# Patient Record
Sex: Male | Born: 1985 | Race: White | Hispanic: No | Marital: Single | Smoking: Current every day smoker
Health system: Northeastern US, Academic
[De-identification: ages and names within clinical notes are randomized; demographics above are authoritative.]

---

## 2017-06-15 ENCOUNTER — Ambulatory Visit: Admit: 2017-06-15 | Discharge: 2017-06-16 | Payer: BC Managed Care – PPO

## 2017-06-15 ENCOUNTER — Encounter: Admit: 2017-06-15 | Discharge: 2017-06-15 | Payer: BC Managed Care – PPO

## 2017-06-15 DIAGNOSIS — H18603 Keratoconus, unspecified, bilateral: Principal | ICD-10-CM

## 2017-06-15 MED ORDER — OLOPATADINE 0.2 % OP DROP
1 [drp] | Freq: Every day | OPHTHALMIC | 6 refills | 27.50000 days | Status: AC
Start: 2017-06-15 — End: 2018-11-08

## 2017-06-20 NOTE — Assessment & Plan Note
Medically necessary RGP scleral lens diagnostic fit done today               Diagnosis:  keratoconus               See notes on lens fit, order    Schedule dispense @ [facility] when lens arrives, previous wearer  Need OCT over lens / autorefract after insertion      Medically necessary CL fit due to keratoconus (E45.409)  If insurance coverage, please order following lenses

## 2017-07-09 ENCOUNTER — Encounter: Admit: 2017-07-09 | Discharge: 2017-07-09 | Payer: BC Managed Care – PPO

## 2017-07-09 NOTE — Telephone Encounter
PT would like to know if he needs to have an appointment at this time, his contacts are in and was called by optical, Christine/Optical stated there were no instructions about   making an appointment.  He is insistent about receiving a call about this issue, Says he can never get anyone in optical to  return his call nor get an answer. Complaining about being treated rude and did not want to come in until he hears from Dr.

## 2017-07-11 ENCOUNTER — Ambulatory Visit: Admit: 2017-07-11 | Discharge: 2017-07-11 | Payer: BC Managed Care – PPO

## 2017-07-11 DIAGNOSIS — H18603 Keratoconus, unspecified, bilateral: Principal | ICD-10-CM

## 2017-07-13 NOTE — Progress Notes
There is no height or weight on file to calculate BMI.              Assessment and Plan:    Problem   Keratoconus of Both Eyes    Diagnoses in [facility] 2017         Keratoconus of both eyes  Scleral lens dispense today   OD - almost central touch, increase 100 um, steep meridian slight steep   OS - good central, edges ok   Need to incorporate overRx    Pt left with current lenses, ok to wear  Will order new trials    Lens on order from [facility], needs appt at [facility] when it arrives   Brand Base Curve Diameter Sphere Cylinder Axis Lens   Right Synergeyes VS scleral 3700 / 8.0 15.0 +0.75 -1.50 067 36-44   Left Synergeyes VS scleral 3500 / 8.0 15.0 +0.50 Sphere  36-40

## 2017-07-13 NOTE — Assessment & Plan Note
Scleral lens dispense today   OD - almost central touch, increase 100 um, steep meridian slight steep   OS - good central, edges ok   Need to incorporate overRx    Pt left with current lenses, ok to wear  Will order new trials

## 2017-08-15 ENCOUNTER — Encounter: Admit: 2017-08-15 | Discharge: 2017-08-15 | Payer: BC Managed Care – PPO

## 2017-08-15 NOTE — Telephone Encounter
Left message to call and schedule, adv nothing avail till January

## 2017-08-24 ENCOUNTER — Encounter: Admit: 2017-08-24 | Discharge: 2017-08-24 | Payer: BC Managed Care – PPO

## 2017-08-24 ENCOUNTER — Ambulatory Visit: Admit: 2017-08-24 | Discharge: 2017-08-25 | Payer: BC Managed Care – PPO

## 2017-08-24 DIAGNOSIS — H18603 Keratoconus, unspecified, bilateral: Principal | ICD-10-CM

## 2017-08-24 NOTE — Progress Notes
There is no height or weight on file to calculate BMI.              Assessment and Plan:    Problem   Keratoconus of Both Eyes    Diagnoses in [facility] 2017         Keratoconus of both eyes  Scleral lens dispense today   OD - excessive impingement, temporal pinguecula worse, good central, lens flexure   OS - good central, edges ok, impinge on pinguecula    Will reduce diameter OU to avoid pinguecula, return to sph only OD, overRx next time      Too steep OD, lens flexure, landing on pinguecula, shrink diameter OU  Lens below is on order, schedule dispense when it arrives (talk with Vickie about schedule)   Brand Base Curve Diameter Sphere Cylinder Axis Lens   Right Synergeyes VS scleral 3700 / 8.0 14.0 +0.25 Sphere  36-42   Left Synergeyes VS scleral 3500 / 8.0 14.0 +0.50 Sphere  36-40

## 2017-09-07 ENCOUNTER — Encounter: Admit: 2017-09-07 | Discharge: 2017-09-07 | Payer: BC Managed Care – PPO

## 2017-09-10 NOTE — Assessment & Plan Note
Scleral lens dispense today   OD - excessive impingement, temporal pinguecula worse, good central, lens flexure   OS - good central, edges ok, impinge on pinguecula    Will reduce diameter OU to avoid pinguecula, return to sph only OD, overRx next time

## 2017-11-02 ENCOUNTER — Encounter: Admit: 2017-11-02 | Discharge: 2017-11-02 | Payer: BC Managed Care – PPO

## 2017-11-02 ENCOUNTER — Ambulatory Visit: Admit: 2017-11-02 | Discharge: 2017-11-03 | Payer: BC Managed Care – PPO

## 2017-11-02 DIAGNOSIS — H18603 Keratoconus, unspecified, bilateral: Principal | ICD-10-CM

## 2017-12-26 ENCOUNTER — Encounter: Admit: 2017-12-26 | Discharge: 2017-12-26 | Payer: BC Managed Care – PPO

## 2017-12-28 ENCOUNTER — Encounter: Admit: 2017-12-28 | Discharge: 2017-12-28 | Payer: BC Managed Care – PPO

## 2017-12-28 ENCOUNTER — Ambulatory Visit: Admit: 2017-12-28 | Discharge: 2017-12-28 | Payer: BC Managed Care – PPO

## 2017-12-28 DIAGNOSIS — H1013 Acute atopic conjunctivitis, bilateral: ICD-10-CM

## 2017-12-28 DIAGNOSIS — H18603 Keratoconus, unspecified, bilateral: Principal | ICD-10-CM

## 2018-10-29 ENCOUNTER — Encounter: Admit: 2018-10-29 | Discharge: 2018-10-29 | Payer: BC Managed Care – PPO

## 2018-10-30 NOTE — Telephone Encounter
Spoke with pt, they will be coming in for their annual on 11/08/18 7:45.  This was the soonest available time slot.

## 2018-11-08 ENCOUNTER — Ambulatory Visit: Admit: 2018-11-08 | Discharge: 2018-11-09 | Payer: No Typology Code available for payment source

## 2018-11-08 ENCOUNTER — Encounter: Admit: 2018-11-08 | Discharge: 2018-11-08 | Payer: BC Managed Care – PPO

## 2018-11-08 DIAGNOSIS — H18623 Keratoconus, unstable, bilateral: Principal | ICD-10-CM

## 2018-11-08 DIAGNOSIS — H1013 Acute atopic conjunctivitis, bilateral: ICD-10-CM

## 2018-11-08 MED ORDER — NEOMYCIN-POLYMYXIN B-DEXAMETH 3.5MG/ML-10,000 UNIT/ML-0.1 % OP DRPS
1 [drp] | Freq: Two times a day (BID) | OPHTHALMIC | 1 refills | 10.00000 days | Status: AC
Start: 2018-11-08 — End: ?

## 2018-11-08 MED ORDER — OLOPATADINE 0.2 % OP DROP
1 [drp] | Freq: Every morning | OPHTHALMIC | 11 refills | 27.50000 days | Status: AC
Start: 2018-11-08 — End: 2019-11-26

## 2018-11-25 NOTE — Assessment & Plan Note
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

## 2018-11-25 NOTE — Assessment & Plan Note
Progression of KCN has changed scleral lens fitting relationship   First, we recommend establishing with cornea surface to stage the condition and discuss CXL and surgical options   Second, a hybrid soft/RGP combo may be his best CL option moving forward if he wishes to continue in CLs

## 2018-11-26 ENCOUNTER — Encounter: Admit: 2018-11-26 | Discharge: 2018-11-26 | Payer: BC Managed Care – PPO

## 2018-11-29 ENCOUNTER — Encounter: Admit: 2018-11-29 | Discharge: 2018-11-29 | Payer: BC Managed Care – PPO

## 2019-01-19 NOTE — Progress Notes
??? Highest education level: Not on file   Occupational History   ??? Not on file   Tobacco Use   ??? Smoking status: Never Smoker   ??? Smokeless tobacco: Never Used   Substance and Sexual Activity   ??? Alcohol use: Not on file   ??? Drug use: Not on file   ??? Sexual activity: Not on file   Other Topics Concern   ??? Not on file   Social History Narrative   ??? Not on file         Current Outpatient Medications:   ???  olopatadine (PATADAY) 0.2 % ophthalmic solution, Apply one drop to both eyes every morning., Disp: 2.5 mL, Rfl: 11    has No Known Allergies.    Review of Systems    Objective     Base Eye Exam     Visual Acuity (Snellen - Linear)       Right Left    Dist cc 20/40 -2 20/30 +2   Correction:  Contacts           Tonometry (Tonopen, 2:10 PM)       Right Left    Pressure 8 10          Pupils       Dark Shape APD    Right 4 Round None    Left 4 Round None          Visual Fields       Left Right     Full Full          Extraocular Movement       Right Left     Full, Ortho Full, Ortho          Neuro/Psych    Oriented x3:  Yes  Mood/Affect:  Normal           Dilation     Both eyes:  1.0% Tropicamide, 2.5% Phenylephrine @ 2:11 PM            Slit Lamp and Fundus Exam     Slit Lamp Exam       Right Left    Lids/Lashes Normal Normal    Conjunctiva/Sclera White and quiet White and quiet    Cornea keratoconus keratoconus    Anterior Chamber Deep and quiet Deep and quiet    Iris Flat Flat    Lens Clear Clear    Vitreous Normal Normal          Fundus Exam       Right Left    Disc Sharp, healthy rim Sharp, healthy rim    C/D Ratio 0.2 0.2    Macula Flat Flat    Vessels Normal caliber and number Normal caliber and number    Periphery Attached, no breaks or tears Attached, no breaks or tears                   Galilei RIGHT eye  Anterior Instantaneous Curvature  Mean keratometry 53.15 diopters  Astigmatism 19.53 diopters   IRREGULAR astigmatism    Corneal pachymetry  CCT 388 microns  Thinnest pachymetry 318 microns    Anterior elevation CENTRAL CONE    Posterior elevation  93 MICRONS ELEVATED    Keratoconus probability   100%          Galilei LEFT eye  Anterior Instantaneous Curvature  Mean keratometry 54.30 diopters  Astigmatism 16.07 diopters   IRREGULAR astigmatism    Corneal pachymetry  CCT 420 microns  Thinnest pachymetry 388 microns    Anterior elevation  CENTRAL CONE    Posterior elevation  65 MICRONS ELEVATED    Keratoconus probability   100%    EXAMINATION DIAGNOSIS:  1. Keratoconus, unstable, bilateral     2. Irregular astigmatism of both eyes     3. Allergic conjunctivitis, bilateral          RESIDENT/FELLOW COMMENTS:  Assessment:  NA     Plan:  NA     FACULTY COMMENTS:  Assessment:  ADVANCED CENTRAL KERATOCONUS  KERATOMETRY 54 DIOPTERS CENTRALLY  THINNEST PACHYMETRY LESS THAN 400 MICRONS IN RIGHT EYE, RIGHT EYE IS MORE AFFECTED  NORMAL DRESDEN PROTOCOL NOT RECOMMENDED  COULD CONSIDER EPITHELIUM ON TREATMENT RIGHT EYE - EFFECT EXPECTED TO BE LESS DESIRABLE  EPITHELIUM OFF TREATMENT PER DRESDEN PROTOCOL APPROPRIATE LEFT EYE  COLLAGEN CROSSLINKAGE MAY SLOW PROGRESSION, BUT NOT IMPROVE VISION  IF CONTACT LENSES CANNOT BE WORN, KERATOPLASTY IS INDICATED    Plan:  PROCEED WITH COLLAGEN CROSSLINKAGE RIGHT EYE FIRST (EPITHELIUM ON)    Teaching Statement  I have interviewed and examined the patient and confirm the pertinent findings. I have discussed the case with the resident/fellow and agree with the findings and plan as documented.    (electronically signed)  Carron Brazen, MD  Professor, Clinical Ophthalmology  Cornea and External Diseases  [facility])  Department of Ophthalmology   [facility]  [address]  [place], [place] [address]  [phone] (phone)  [email or web address] (email)

## 2019-01-24 ENCOUNTER — Ambulatory Visit: Admit: 2019-01-24 | Discharge: 2019-01-25 | Payer: BC Managed Care – PPO

## 2019-01-24 ENCOUNTER — Encounter: Admit: 2019-01-24 | Discharge: 2019-01-24 | Payer: BC Managed Care – PPO

## 2019-01-24 DIAGNOSIS — H1013 Acute atopic conjunctivitis, bilateral: ICD-10-CM

## 2019-01-24 DIAGNOSIS — H18623 Keratoconus, unstable, bilateral: Principal | ICD-10-CM

## 2019-01-24 DIAGNOSIS — H52213 Irregular astigmatism, bilateral: ICD-10-CM

## 2019-01-27 ENCOUNTER — Encounter: Admit: 2019-01-27 | Discharge: 2019-01-27 | Payer: BC Managed Care – PPO

## 2019-01-27 MED ORDER — TETRACAINE HCL (PF) 0.5 % OP DROP
1 [drp] | Freq: Once | OPHTHALMIC | 0 refills | Status: CN
Start: 2019-01-27 — End: ?

## 2019-01-27 MED ORDER — MOXIFLOXACIN 0.5 % OP DROP
1 [drp] | OPHTHALMIC | 0 refills | Status: CN
Start: 2019-01-27 — End: ?

## 2019-02-07 ENCOUNTER — Encounter: Admit: 2019-02-07 | Discharge: 2019-02-07 | Payer: BC Managed Care – PPO

## 2019-02-07 ENCOUNTER — Ambulatory Visit: Admit: 2019-02-07 | Discharge: 2019-02-08 | Payer: No Typology Code available for payment source

## 2019-02-07 DIAGNOSIS — H18623 Keratoconus, unstable, bilateral: Principal | ICD-10-CM

## 2019-02-07 NOTE — Assessment & Plan Note
Seen by cornea and cross-linking is recommended  Given the pandemic, procedures are on hold for now  We will wait to update CLs until after the procedure  Consider OneFit Med with peripheral recess moving forward

## 2019-02-20 ENCOUNTER — Encounter: Admit: 2019-02-20 | Discharge: 2019-02-20

## 2019-02-20 ENCOUNTER — Ambulatory Visit: Admit: 2019-02-20 | Discharge: 2019-02-21

## 2019-02-20 DIAGNOSIS — H1013 Acute atopic conjunctivitis, bilateral: Secondary | ICD-10-CM

## 2019-02-20 DIAGNOSIS — H18623 Keratoconus, unstable, bilateral: Secondary | ICD-10-CM

## 2019-02-20 DIAGNOSIS — H18823 Corneal disorder due to contact lens, bilateral: Secondary | ICD-10-CM

## 2019-02-20 MED ORDER — PREDNISOLONE ACETATE 1 % OP DRPS
1 [drp] | Freq: Four times a day (QID) | OPHTHALMIC | 0 refills | 5.00000 days | Status: DC
Start: 2019-02-20 — End: 2019-02-20

## 2019-02-20 MED ORDER — TOBRAMYCIN-DEXAMETHASONE 0.3-0.1 % OP DRPS
1 [drp] | Freq: Four times a day (QID) | OPHTHALMIC | 0 refills | 7.00000 days | Status: AC
Start: 2019-02-20 — End: ?

## 2019-02-20 NOTE — Assessment & Plan Note
Eyepain and discomfort today OU  Wears scleral lenses daily in both eyes to improve vision  Progression of his KCN has changed scleral lens fitting relationship, the lenses are no longer a good fit.    He is visually dependent on the lenses to perform his job, and continues to wear them despite the discomfort   We will start tobradex qid OU x 14 days   We recommend discontinuing his scleral lenses (if at all possible) while on the eye drops    He has a appointment with Dr Alinda Money next week to undergo CXL

## 2019-02-20 NOTE — Progress Notes
There is no height or weight on file to calculate BMI.              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    He is visually dependent on the lenses to perform his job, and continues to wear them despite the discomfort   We will start tobradex qid OU x 14 days   We recommend discontinuing his scleral lenses (if at all possible) while on the eye drops    He has a appointment with Dr Alinda Money next week to undergo CXL    Allergic conjunctivitis, bilateral  Full time outside for job, continues to have significant allergic response in eyelids  Steroid will help, continue pataday bid OU        Was planning on seeing Dr Alinda Money next Thursday for CXL        Next Optometry visit -- prn  Test  Comments   MRx  _0     Galilei  _1     OCT  _2  Mac  _3  Nerve  _4  Ant seg    TOSM _5     Schirmer  _6  1 Drop of Proparacaine   _7  No numbing drops    IOP  _8  Tono   _9  iCare   _10  Applanate    Pachy  _11     HVF  _12  Sita Fast  _13  24-2       _14  Standard  _15  10-2         Optos Photos  _16         Dilate  _17 1 drop Tropicamide 1% &  Phenylephrine 2.5%   _18  Other (see comments)

## 2019-02-20 NOTE — Telephone Encounter
Patient called stating he was fit with scleral lens and it's not causing pain and double vision.

## 2019-02-20 NOTE — Assessment & Plan Note
Full time outside for job, continues to have significant allergic response in eyelids  Steroid will help, continue pataday bid OU

## 2019-02-21 ENCOUNTER — Encounter: Admit: 2019-02-21 | Discharge: 2019-02-21

## 2019-02-21 NOTE — Telephone Encounter
Leonides Schanz Called to ask if Dr.T has talked with Dr. Alinda Money to see whats going on?  He was seen yesterday 02/19/2019. His next appointment is on November 13th 2020 at 9:00 AM. With Dr T.

## 2019-03-25 ENCOUNTER — Encounter: Admit: 2019-03-25 | Discharge: 2019-03-26

## 2019-03-25 DIAGNOSIS — Z1159 Encounter for screening for other viral diseases: Secondary | ICD-10-CM

## 2019-03-25 NOTE — Progress Notes
Patient arrived to [facility] for COVID-19 testing 03/25/19 1654. Patient identity confirmed via photo I.D. Nasopharyngeal procedure explained to the patient.   Nasopharyngeal swab completed left  Patient education provided given and instructed patient self isolate until contacted w/ results and further instructions.   Swab collected by tiffany rito.    Date symptoms began/reason for testing: preop

## 2019-03-27 ENCOUNTER — Encounter: Admit: 2019-03-27 | Discharge: 2019-03-27

## 2019-03-27 ENCOUNTER — Ambulatory Visit: Admit: 2019-03-27 | Discharge: 2019-03-28

## 2019-03-27 DIAGNOSIS — H18623 Keratoconus, unstable, bilateral: Secondary | ICD-10-CM

## 2019-03-27 LAB — COVID-19 (SARS-COV-2) PCR

## 2019-03-27 MED ORDER — PREDNISOLONE ACETATE 1 % OP DRPS
Freq: Three times a day (TID) | 1 refills | 5.00000 days | Status: AC
Start: 2019-03-27 — End: ?

## 2019-03-27 MED ORDER — OXYCODONE-ACETAMINOPHEN 5-325 MG PO TAB
1 | ORAL_TABLET | ORAL | 0 refills | 2.00000 days | Status: DC | PRN
Start: 2019-03-27 — End: 2019-03-27

## 2019-03-27 MED ORDER — OXYCODONE-ACETAMINOPHEN 5-325 MG PO TAB
1 | ORAL_TABLET | ORAL | 0 refills | 2.00000 days | Status: AC | PRN
Start: 2019-03-27 — End: ?

## 2019-03-27 MED ORDER — MOXIFLOXACIN 0.5 % OP DROP
1 [drp] | Freq: Four times a day (QID) | OPHTHALMIC | 1 refills | 7.00000 days | Status: AC | PRN
Start: 2019-03-27 — End: ?

## 2019-03-27 NOTE — Progress Notes
Ophthalmology Minor Room/Clinic Procedure Note     Surgical Service:  Ophthalmology and Visual Sciences  Patient name: Anthony Freeman  MRN:  [id number]  Date Performed:  03/27/2019  Attending Staff:  Camille Bal, MD  Consent Obtained:  After reviewing the potential risks and benefits as well as the performance of the procedure with the patient, an informed consent was obtained.  Procedure:  Collagen Crosslinkage ??? [place] protocol   Laterality:  Right   Indication: Progression in keratoconus and contact lens intolerance         Operative Findings (right eye):      The patient was brought to the collagen crosslinkage suite after being given Diazepam 5 mg orally for analgesia. A surgical timeout was done with the surgeon and technician staff present, so as to identify the patient and to ascertain the surgical eye being treated.  The patient was placed in the operating room chair and adjusted so that the head was in a comfortable position.   Tetracaine 1% was given topically for anesthesia. The operative eye was prepped with Betadyne 5% solution, and then draped.  A lid speculum was placed into the fornix to open the eyelids.  An Amoils scrubber 8.5 mm was used to remove the surface epithelium.  The ocular surface was rinsed with BSS.  After removing the corneal surface epithelium, the cornea was pretreated with Riboflavin Ophthalmic Solution to saturate the corneal tissue with the riboflavin photosensitizer, for approximately 20 minutes. The corneal thickness was measured and found to be approximately 400 microns.  The cornea was then irradiated with UVA (365 nm) at 3 mW/cm2 for 30 minutes for a total radiant exposure of 5.4 J/cm2.    After the procedure, topical moxifloxacin and prednisolone were applied to the fornix.  A therapeutic bandage contact lens was placed onto the cornea (Avaira Vitality, BC 8.4, PWR -5.00).    Estimated blood loss: zero    Complications:  The patient did not experience any complications Attending Statement:  Dr. Marvene Staff Lakecia Deschamps was present and performed the entire procedure.    (electronically signed)  Carron Brazen, MD  Professor, Clinical Ophthalmology  Cornea and External Diseases  [facility])  Department of Ophthalmology   [facility]  [address]  [place], [place] [address]  [phone] (phone)  [email or web address] (email)

## 2019-03-27 NOTE — Patient Instructions
PREDNISOLONE ACETATE 1% (PINK OR WHITE TOP)  -FOUR TIMES DAILY FOR 1 WEEK,  -THREE TIMES DAILY FOR 1 WEEK,  -TWO TIMES DAILY FOR 1 WEEK,  -ONCE DAILY FOR 1 WEEK,  -THEN STOP    MOXIFLOXACIN (TAN TOP)  -FOUR TIMES DAILY FOR 7 DAYS,  -THEN STOP    PRESERVATIVE FREE ARTIFICIAL TEARS  -USE EVERY TWO HOURS WHILE AWAKE    OXYCODONE/ACETAMINOPHEN (ENDOCET) 5/325 MG TABLET  -USE ONE TABLET EVERY 4 HOURS HAS NEEDED FOR PAIN

## 2019-03-28 NOTE — Progress Notes
Contacted patient and confirmed name and DOB. Patient advised that COVID-19 test results are negative. Advised that if they develop any concerning symptoms to contact their procedure team, specialist, and/or PCP for assistance.

## 2019-04-01 ENCOUNTER — Encounter: Admit: 2019-04-01 | Discharge: 2019-04-01

## 2019-04-01 ENCOUNTER — Ambulatory Visit: Admit: 2019-04-01 | Discharge: 2019-04-02

## 2019-04-01 DIAGNOSIS — Z9889 Other specified postprocedural states: Secondary | ICD-10-CM

## 2019-04-01 DIAGNOSIS — H18623 Keratoconus, unstable, bilateral: Secondary | ICD-10-CM

## 2019-04-01 NOTE — Progress Notes
Cornea Clinic    Encounter Date: 04/01/2019    Subjective:    Anthony Freeman is a 33 y.o. male .    Post-op (Pt presents for 5 day FUV s/p corneal crosslinking OD. Pt reports vision is ok CL is irritating ); Light Sensitivity (more light sensitive since procedure, but manageable.  ); Dry Eye (Due to the Cl ); and Treatment (Vigamox pred QID OD.  )    HPI   POSTOPERATIVE COLLAGEN CROSSLINKAGE RIGHT EYE POD#5    This Visit:     I-Design 2.0 Wavescan testing (2nd floor)    Corneal topography Atlas    Corneal topography Galilei    IOL Master with Barrett formula (SN60WF, CZ70BD, Symphony, Panoptic)    OCT (anterior segment, cornea, macula, optic nerve) (Cirrus, [place]idelberg)        Snellen vision XX   Ocular dominance determination    Manifest refraction    Cycloplegic refraction    Brightness acuity test (BAT)    Pupillary response for relative afferent pupillary defect (RAPD)    Ishihara Pseudoisochromatic plates    IOP using TonoPen as standard or Pneumotonometer in Kpro patients  XX   Pachymetry    Lissamine green or fluorescein stain of ocular surface    Schirmer test with anesthesia    Dilate    JEWELLERS FORCEPS TO REMOVE BCL XX   A-Scan with IOL calculation    B-Scan    UBM - immersion 50 Mhz         Optos wide field photography    Humphrey visual field 24-2    Specular microscopy    Confocal microscopy         Order ProKera amniotic membrane    Order amphotericin b and/or vancomycin (O'Briens)    Kontur contact lens    Betadyne rinse of fornix    ccc  History reviewed. No pertinent past medical history.  History reviewed. No pertinent surgical history.  Family History   Problem Relation Age of Onset   ??? Glaucoma Paternal Grandfather    ??? Cataract Paternal Grandfather    ??? Macular Degen Neg Hx    ??? Retinal Detachment Neg Hx    ??? Strabismus Neg Hx    ??? Blindness Neg Hx    ??? Amblyopia Neg Hx    ??? Diabetes Neg Hx      Social History     Socioeconomic History   ??? Marital status: Single Spouse name: Not on file   ??? Number of children: Not on file   ??? Years of education: Not on file   ??? Highest education level: Not on file   Occupational History   ??? Not on file   Tobacco Use   ??? Smoking status: Never Smoker   ??? Smokeless tobacco: Never Used   Substance and Sexual Activity   ??? Alcohol use: Not on file   ??? Drug use: Not on file   ??? Sexual activity: Not on file   Other Topics Concern   ??? Not on file   Social History Narrative   ??? Not on file         Current Outpatient Medications:   ???  moxifloxacin (VIGAMOX) 0.5 % ophthalmic solution, Apply one drop to right eye as directed four times a day while awake as needed. for 1 week after surgery  Indications: pink eye from bacterial infection, Disp: 3 mL, Rfl: 1  ???  olopatadine (PATADAY) 0.2 % ophthalmic solution, Apply one drop to both  eyes every morning., Disp: 2.5 mL, Rfl: 11  ???  oxyCODONE/acetaminophen (ENDOCET) 5/325 mg tablet, Take one tablet by mouth every 4 hours as needed for Pain, Disp: 20 tablet, Rfl: 0  ???  prednisolone acetate (PRED FORTE) 1 % ophthalmic suspension, 1 drop in surgical eye(s) QID X 1 week after surgery, then TID X 1 week, then BID X 1 week, then daily X 1 week, then stop  Indications: severe inflammation of the eye, Disp: 10 mL, Rfl: 1    has No Known Allergies.    Review of Systems    Objective     Base Eye Exam     Visual Acuity (Snellen - Linear)       Right Left    Dist sc 20/300 20/200          Tonometry (Tonopen, 10:24 AM)       Right Left    Pressure 13cl 10          Neuro/Psych    Oriented x3:  Yes  Mood/Affect:  Normal                     EXAMINATION DIAGNOSIS:  1. Unstable keratoconus of both eyes     2. History of superficial keratectomy with riboflavin and uv light, right eye         RESIDENT/FELLOW COMMENTS:  Assessment:  NA    Plan:  NA     FACULTY COMMENTS:  Assessment:  Postoperative collagen crosslinkage right eye - stable    Plan:  PREDNISOLONE ACETATE 1% (PINK OR WHITE TOP)  -FOUR TIMES DAILY FOR 1 WEEK, -THREE TIMES DAILY FOR 1 WEEK, - you should be here now  -TWO TIMES DAILY FOR 1 WEEK,  -ONCE DAILY FOR 1 WEEK,  -THEN STOP    MOXIFLOXACIN (TAN TOP)  -FOUR TIMES DAILY FOR 7 DAYS,  -THEN STOP - you should be here now    PRESERVATIVE FREE ARTIFICIAL TEARS  -USE EVERY TWO HOURS WHILE AWAKE    RETURN VISIT IN APPROXIMATELY ONE MONTH  OKAY TO USE RIBOFLAVIN 100 TO 400 MG DAILY WITH THIRTY MINUTES SUNSHINE    Teaching Statement  I have interviewed and examined the patient and confirm the pertinent findings. I have discussed the case with the resident/fellow and agree with the findings and plan as documented.    (electronically signed)  Carron Brazen, MD  Professor, Clinical Ophthalmology  Cornea and External Diseases  [facility])  Department of Ophthalmology   [facility]  [address]  [place], [place] [address]  [phone] (phone)  [email or web address] (email)

## 2019-04-02 ENCOUNTER — Encounter: Admit: 2019-04-02 | Discharge: 2019-04-02

## 2019-04-03 ENCOUNTER — Encounter: Admit: 2019-04-03 | Discharge: 2019-04-03

## 2019-04-03 NOTE — Telephone Encounter
PE spoke with patient at the ned of the day, he was pretty upset about a few things, he does not get phone calls returned in a timely manner or sometimes his calls do not get returned at all.      Pt called early Wednesday and asked that he get a return call by noon, Heather gave me a post it not with that info and I asked her to call him back and let him know that Dr Darcus Austin is in clinic, its an overbooked day and I wont be able to talk to him in between patients but that I will call him later in the day. He did not get that phone call. He called 3 more times that day and was more upset with every call. I explained to him that it is my job to call patients back and I try to in between patients or at the end of clinic, today was a long clinic this morning and I did not see the Dr at lunch breaks, I know that does not explain every other time he did not get calls returned but I let him know that if he asks specifically for me I will get the message faster and can respond better for him. (I spoke with Freida Busman about this as it seems to be a regular patient complaint)    He is also bothered by the fact that every time he has to come in he has to take off work.       He mentioned that when he was here Monday Dr Darcus Austin wanted him to wait for 4-8 weeks before seeing Dr T for scleral lenses, when he called Wednesday he told me that his lens hurts his eye so bad now and that the vision is horrible, Dr Darcus Austin said to go ahead and see Dr T. He wants to know why he said one day to wait but then its ok today. That's another trip he has to make and take off work.       He states he is also not given enough information by the doctors when he is here.      He had taken off work to have crosslinking 2 months ago and was told by Maralyn Sago that he could not have the procedure that day due to infection, he didn't know he had an infection, he didn't know why Maralyn Sago was telling him and he didn't hear that from the Dr. He was upset about the outcome of his crosslinking or maybe his expectation was a little high. I talked to Dr Darcus Austin about it and he said vision can change for months, he may get an increase in vision of about 2 diopters but it will come with time. He also said that he is healing really well so he is ok with the patient getting a contact from Dr T and since there is pain and blurred vision he ok'd that visit but he would prefer to wait in most cases.     I talked to Freida Busman about all of these issues as well as Lequita Halt since he is also a patient of Dr Karie Schwalbe. Lequita Halt cleared some time to make accommodations for patient to be seen this Friday (2 days from day of phone call) to Dr T. Both Lequita Halt and Dr will come in early and see PT at his requested time of 8am Friday July 24.

## 2019-04-03 NOTE — Telephone Encounter
Pt is reporting discomfort while wearing his scleral lens post crosslinking.  We would like to squeeze him into Dr. Julious Payer schedule tomorrow 04/03/2019 at 10:15.  ONLY IF that time does not work for him can we offer him the 8:30.  Peggy left him a voicemail this morning about this.  We plan to try and call him again this afternoon.

## 2019-04-03 NOTE — Telephone Encounter
PE spoke to patient

## 2019-04-04 ENCOUNTER — Encounter: Admit: 2019-04-04 | Discharge: 2019-04-04

## 2019-04-04 ENCOUNTER — Ambulatory Visit: Admit: 2019-04-04 | Discharge: 2019-04-05

## 2019-04-04 DIAGNOSIS — H18623 Keratoconus, unstable, bilateral: Secondary | ICD-10-CM

## 2019-04-10 NOTE — Assessment & Plan Note
Progressive KCN bilaterally  Recently underwent CXL OD, plans for OS as well    Success with scleral lens in past (from a vision standpoint), he has an irregular scleral contour which complicates the fitting process    Medically necessary RGP scleral lens diagnostic fit done today               Diagnosis:  Keratoconus, both eyes (J94.174)               See notes on lens fit, order    Schedule dispense @ [facility] when lens arrives  Need OCT over lens / autorefract after insertion      Medically necessary CL fit due to keratoconus, both eyes (Y81.448)  If insurance coverage, please order following lenses

## 2019-04-10 NOTE — Progress Notes
There is no height or weight on file to calculate BMI.       CORNEAL TOPOGRAPHY           OD: significant K steepening, worse inferior.  Thinning present as well, KCN  OS: significant K steepening, worse inferior with associated thinning, KCN             Order for topo inadvertenly left off day of visit, date of service was 04/04/19       Assessment and Plan:    Problem   Keratoconus, Unstable, Bilateral    Diagnoses in [facility] [address] 2017         Keratoconus, unstable, bilateral  Progressive KCN bilaterally  Recently underwent CXL OD, plans for OS as well    Success with scleral lens in past (from a vision standpoint), he has an irregular scleral contour which complicates the fitting process    Medically necessary RGP scleral lens diagnostic fit done today               Diagnosis:  Keratoconus, 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 Specs Centration   Right Onefit MED 4600 15.6 -3.25 Sphere std,std, +75/-25 CPR 3.5 chord, .7 depth HydraPEG   Left Onefit MED 4600 15.6 -1.75 Sphere std,std, +75/-25  HydraPEG           Next Optometry visit -- when scleral lenses arrive  Test  Comments   MRx  _0     Galilei  _1     OCT  _2  Mac  _3  Nerve  _4  Ant seg Over scleral   TOSM _5     Schirmer  _6  1 Drop of Proparacaine   _7  No numbing drops    IOP  _8  Tono   _9  iCare   _10  Applanate    Pachy  _11     HVF  _12  Sita Fast  _13  24-2       _14  Standard  _15  10-2         Optos Photos  _16         Dilate  _17 1 drop Tropicamide 1% &  Phenylephrine 2.5%   _18  Other (see comments)

## 2019-04-29 NOTE — Progress Notes
Cornea Clinic    Encounter Date: 04/30/2019    Subjective:    Anthony Freeman is a 33 y.o. male .    Eye Exam (4 week return pt; unstable keratoconus OU); Vision Change (Pt was seen with Dr. Karie Schwalbe and fitted for new scleral lenses. pt reports va is good but the previous lenses were uncomfortable. ); and Medication Update (Pred taper; Moxi qid x7d then stop and PFATS q2h while awake)    HPI   Postoperative collagen crosslinkage  This Visit:     I-Design 2.0 Wavescan testing (2nd floor)    Corneal topography Atlas    Corneal topography Galilei xx   IOL Master with Barrett formula (SN60WF, CZ70BD, Symphony, Panoptic)    OCT (anterior segment, cornea, macula, optic nerve) (Cirrus, [place])        Snellen vision xx   Ocular dominance determination    Manifest refraction xx   Cycloplegic refraction    Brightness acuity test (BAT)    Pupillary response for relative afferent pupillary defect (RAPD)    Ishihara Pseudoisochromatic plates    IOP using TonoPen as standard or Pneumotonometer in Kpro patients     Pachymetry    Lissamine green or fluorescein stain of ocular surface    Schirmer test with anesthesia    Dilate        A-Scan with IOL calculation    B-Scan    UBM - immersion 50 Mhz         Optos wide field photography    Humphrey visual field 24-2    Specular microscopy    Confocal microscopy         Order ProKera amniotic membrane    Order amphotericin b and/or vancomycin (O'Briens)    Kontur contact lens    Betadyne rinse of fornix    ccc  No past medical history on file.  No past surgical history on file.  Family History   Problem Relation Age of Onset   ??? Glaucoma Paternal Grandfather    ??? Cataract Paternal Grandfather    ??? Macular Degen Neg Hx    ??? Retinal Detachment Neg Hx    ??? Strabismus Neg Hx    ??? Blindness Neg Hx    ??? Amblyopia Neg Hx    ??? Diabetes Neg Hx      Social History     Socioeconomic History   ??? Marital status: Single     Spouse name: Not on file   ??? Number of children: Not on file ??? Years of education: Not on file   ??? Highest education level: Not on file   Occupational History   ??? Not on file   Tobacco Use   ??? Smoking status: Never Smoker   ??? Smokeless tobacco: Never Used   Substance and Sexual Activity   ??? Alcohol use: Not on file   ??? Drug use: Not on file   ??? Sexual activity: Not on file   Other Topics Concern   ??? Not on file   Social History Narrative   ??? Not on file         Current Outpatient Medications:   ???  moxifloxacin (VIGAMOX) 0.5 % ophthalmic solution, Apply one drop to right eye as directed four times a day while awake as needed. for 1 week after surgery  Indications: pink eye from bacterial infection, Disp: 3 mL, Rfl: 1  ???  olopatadine (PATADAY) 0.2 % ophthalmic solution, Apply one drop to both eyes every morning., Disp:  2.5 mL, Rfl: 11  ???  oxyCODONE/acetaminophen (ENDOCET) 5/325 mg tablet, Take one tablet by mouth every 4 hours as needed for Pain, Disp: 20 tablet, Rfl: 0  ???  prednisolone acetate (PRED FORTE) 1 % ophthalmic suspension, 1 drop in surgical eye(s) QID X 1 week after surgery, then TID X 1 week, then BID X 1 week, then daily X 1 week, then stop  Indications: severe inflammation of the eye, Disp: 10 mL, Rfl: 1    has No Known Allergies.    Review of Systems    Objective     Base Eye Exam     Visual Acuity (Snellen - Linear)       Right Left    Dist cc 20/30 -2 20/25 -1    Dist ph cc 20/20 -1 20/20   Correction:  Contacts           Tonometry (Tonopen, 9:24 AM)       Right Left    Pressure 12 10          Pupils       Dark Shape React APD    Right 4 Round Slow 0    Left 4 Round Slow 0          Visual Fields       Left Right     Full Full          Neuro/Psych    Oriented x3:  Yes  Mood/Affect:  Normal             Slit Lamp and Fundus Exam     External Exam       Right Left    External Normal Normal          Slit Lamp Exam       Right Left    Lids/Lashes Normal Normal    Conjunctiva/Sclera White and quiet, pinguecula White and quiet, pinguecula Cornea epi irregularities/hazing, KCN, inferior thinning KCN, inferior thinning    Anterior Chamber Deep and quiet Deep and quiet    Iris Flat Flat    Lens Clear Clear    Vitreous Normal Normal            Refraction     Wearing Rx    Type:  none           Wearing Rx #2    Type:  none           Manifest Refraction       Sphere Cylinder Axis Dist VA    Right -16.50 +0.25 160 20/40+2    Left -14.00 +1.00 065 20/50    OS no change no matter which lens was offered                 EXAMINATION DIAGNOSIS:  1. Keratoconus, unstable, bilateral     2. 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 I have discussed the case with the resident/fellow and agree with the findings and plan as documented.    (electronically signed)  Carron Brazen, MD  Professor, Clinical Ophthalmology  Cornea and External Diseases  [facility])  Department of Ophthalmology   [facility]  [address]  [place], [place] [address]  [phone] (phone)  [email or web address] (email)

## 2019-04-30 ENCOUNTER — Ambulatory Visit: Admit: 2019-04-30 | Discharge: 2019-05-01

## 2019-04-30 ENCOUNTER — Encounter: Admit: 2019-04-30 | Discharge: 2019-04-30

## 2019-04-30 DIAGNOSIS — Z9889 Other specified postprocedural states: Secondary | ICD-10-CM

## 2019-04-30 DIAGNOSIS — H18623 Keratoconus, unstable, bilateral: Secondary | ICD-10-CM

## 2019-04-30 NOTE — Patient Instructions
EXAMINATION DIAGNOSIS:  1. Keratoconus, unstable, bilateral     2. 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

## 2019-05-02 NOTE — Progress Notes
There is no height or weight on file to calculate BMI.              Assessment and Plan:    Problem   Keratoconus, Unstable, Bilateral    Diagnoses in [place] 2017  CXL OD 03/27/19 a [place] with Dr Alinda Money         Keratoconus, unstable, bilateral  Scleral lens RGP dispense today    Complicated fit due to irregular scleral contour (pinguecula)   Will adjust edges as described in notes   Now able to add CPR OS     Patient did not take lenses home, prefers to wait  Revised lenses on order        Revised lenses below are ordered   Brand  Base Curve  Diameter  Sphere  Cylinder  Lens  Addl. Specs  Centration    Right  Onefit MED  4600  15.6  -2.75  Sphere  std,std, +125/-25  CPR 3.5 chord, .7 depth  HydraPEG    Left  Onefit MED  4600  15.6  -1.50  Sphere  std,std, +125/-25  CPR 3.5 chord, .7 depth  HydraPEG            Next Optometry visit -- scleral lens dispense when new lenses arrive  Test  Comments   MRx  _0  over rx    Galilei  _1     OCT  _2  Mac  _3  Nerve  _4  Ant seg    TOSM _5     Schirmer  _6  1 Drop of Proparacaine   _7  No numbing drops    IOP  _8  Tono   _9  iCare   _10  Applanate    Pachy  _11     HVF  _12  Sita Fast  _13  24-2       _14  Standard  _15  10-2         Optos Photos  _16         Dilate  _17 1 drop Tropicamide 1% &  Phenylephrine 2.5%   _18  Other (see comments)

## 2019-05-02 NOTE — Assessment & Plan Note
Scleral lens RGP dispense today    Complicated fit due to irregular scleral contour (pinguecula)   Will adjust edges as described in notes   Now able to add CPR OS     Patient did not take lenses home, prefers to wait  Revised lenses on order

## 2019-05-20 ENCOUNTER — Encounter: Admit: 2019-05-20 | Discharge: 2019-05-20

## 2019-05-26 NOTE — Progress Notes
Cornea Clinic    Encounter Date: 05/28/2019    Subjective:    Anthony Freeman is a 33 y.o. male .    Eye Exam (1 month FUV; keratoconus OU); Vision Change (Pt reports va is ok  soft ctls OD and a scleral lens OS. 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  Plan:  CONTINUE WITH CURRENT PLAN  IF SURFACE DOESN'T HEAL PROPERLY, I WOULD CONSIDER VITAL TEARS TO HELP PROMOTE HEALING  WILL CONFOCAL AS NEEDED  RETURN CORNEA ONE MONTH    This Visit:     I-Design 2.0 Wavescan testing (2nd floor)    Corneal topography Atlas    Corneal topography Galilei    IOL Master with Barrett formula (SN60WF, CZ70BD, Symphony, Panoptic)    OCT (anterior segment, cornea, macula, optic nerve) (Cirrus, [place])        Snellen vision XX   Ocular dominance determination    Manifest refraction    Cycloplegic refraction    Brightness acuity test (BAT)    Pupillary response for relative afferent pupillary defect (RAPD)    Ishihara Pseudoisochromatic plates    IOP using TonoPen as standard or Pneumotonometer in Kpro patients  XX   Pachymetry    Lissamine green or fluorescein stain of ocular surface    Schirmer test with anesthesia    Dilate        A-Scan with IOL calculation    B-Scan    UBM - immersion 50 Mhz         Optos wide field photography    Humphrey visual field 24-2    Specular microscopy    Confocal microscopy         Order ProKera amniotic membrane    Order amphotericin b and/or vancomycin (O'Briens)    Kontur contact lens    Betadyne rinse of fornix    ccc  No past medical history on file.  No past surgical history on file.  Family History   Problem Relation Age of Onset   ? Glaucoma Paternal Grandfather ? Cataract Paternal Grandfather    ? Macular Degen Neg Hx    ? Retinal Detachment Neg Hx    ? Strabismus Neg Hx    ? Blindness Neg Hx    ? Amblyopia Neg Hx    ? Diabetes Neg Hx      Social History     Socioeconomic History   ? Marital status: Single     Spouse name: Not on file   ? Number of children: Not on file   ? Years of education: Not on file   ? Highest education level: Not on file   Occupational History   ? Not on file   Tobacco Use   ? Smoking status: Never Smoker   ? Smokeless tobacco: Never Used   Substance and Sexual Activity   ? Alcohol use: Not on file   ? Drug use: Not on file   ? Sexual activity: Not on file   Other Topics Concern   ? Not on file   Social History Narrative   ? Not on file  Current Outpatient Medications:   ?  moxifloxacin (VIGAMOX) 0.5 % ophthalmic solution, Apply one drop to right eye as directed four times a day while awake as needed. for 1 week after surgery  Indications: pink eye from bacterial infection, Disp: 3 mL, Rfl: 1  ?  olopatadine (PATADAY) 0.2 % ophthalmic solution, Apply one drop to both eyes every morning., Disp: 2.5 mL, Rfl: 11  ?  oxyCODONE/acetaminophen (ENDOCET) 5/325 mg tablet, Take one tablet by mouth every 4 hours as needed for Pain, Disp: 20 tablet, Rfl: 0  ?  prednisolone acetate (PRED FORTE) 1 % ophthalmic suspension, 1 drop in surgical eye(s) QID X 1 week after surgery, then TID X 1 week, then BID X 1 week, then daily X 1 week, then stop  Indications: severe inflammation of the eye, Disp: 10 mL, Rfl: 1    has No Known Allergies.    Review of Systems    Objective     Base Eye Exam     Visual Acuity (Snellen - Linear)       Right Left    Dist cc 20/30 -2 20/40    Dist ph cc 20/25 -2 20/25   Correction:  Contacts           Tonometry (Tonopen, 3:34 PM)       Right Left    Pressure 10 10          Pupils       Dark APD    Right 4 0    Left 4 0          Visual Fields       Left Right     Full Full          Neuro/Psych    Oriented x3:  Yes Mood/Affect:  Normal                   Dendritic appearing epitheliopathy        EXAMINATION DIAGNOSIS:  1. Keratoconus, unstable, bilateral     2. History of superficial keratectomy with riboflavin and uv light, right eye           RESIDENT/FELLOW COMMENTS:  Assessment:  NA    Plan:  NA     FACULTY COMMENTS:  Assessment:  QUESTIONABLE DENDRITIC KERATITIS RIGHT EYE  SEE PHOTOGRAPH    Plan:  VALACYCLOVIR 1000 MG TWICE DAILY  RETURN IN TWO WEEKS    Teaching Statement  I have interviewed and examined the patient and confirm the pertinent findings. I have discussed the case with the resident/fellow and agree with the findings and plan as documented.    (electronically signed)  Carron Brazen, MD  Professor, Clinical Ophthalmology  Cornea and External Diseases  [facility])  Department of Ophthalmology   [facility]  [address]  [place], [place] [address]  [phone] (phone)  [email or web address] (email)

## 2019-05-28 ENCOUNTER — Ambulatory Visit: Admit: 2019-05-28 | Discharge: 2019-05-29 | Payer: BC Managed Care – PPO

## 2019-05-28 ENCOUNTER — Encounter: Admit: 2019-05-28 | Discharge: 2019-05-28 | Payer: BC Managed Care – PPO

## 2019-05-28 MED ORDER — VALACYCLOVIR 1 GRAM PO TAB
1000 mg | ORAL_TABLET | Freq: Two times a day (BID) | ORAL | 1 refills | Status: DC
Start: 2019-05-28 — End: 2019-07-10

## 2019-06-12 ENCOUNTER — Ambulatory Visit: Admit: 2019-06-12 | Discharge: 2019-06-12 | Payer: BC Managed Care – PPO

## 2019-06-12 ENCOUNTER — Encounter: Admit: 2019-06-12 | Discharge: 2019-06-12 | Payer: BC Managed Care – PPO

## 2019-06-12 DIAGNOSIS — B0052 Herpesviral keratitis: Secondary | ICD-10-CM

## 2019-06-12 DIAGNOSIS — H18623 Keratoconus, unstable, bilateral: Secondary | ICD-10-CM

## 2019-06-12 DIAGNOSIS — Z9889 Other specified postprocedural states: Secondary | ICD-10-CM

## 2019-06-12 NOTE — Progress Notes
Cornea Clinic    Encounter Date: 06/12/2019    Subjective:    Anthony Freeman is a 33 y.o. male .    Eye Exam (2 week FUV; persistent epithelial defect; HSV karatitis OU); Vision Change (Pt reports va is stable OU and he has had no chnages since his last visit. ); and Medication Update (Val 1000mg  oral bid )    HPI   QUESTIONABLE DENDRITIC KERATITIS RIGHT EYE  FACULTY COMMENTS: 05/28/2019  Assessment:  QUESTIONABLE DENDRITIC KERATITIS RIGHT EYE  SEE PHOTOGRAPH  ?  Plan:  VALACYCLOVIR 1000 MG TWICE DAILY  RETURN IN TWO WEEKS    This Visit:     I-Design 2.0 Wavescan testing (2nd floor)    Corneal topography Atlas    Corneal topography Galilei    IOL Master with Barrett formula (SN60WF, CZ70BD, Symphony, Panoptic)    OCT (anterior segment, cornea, macula, optic nerve) (Cirrus, [place])        Snellen vision XX   Ocular dominance determination    Manifest refraction    Cycloplegic refraction    Brightness acuity test (BAT)    Pupillary response for relative afferent pupillary defect (RAPD)    Ishihara Pseudoisochromatic plates    IOP using TonoPen as standard or Pneumotonometer in Kpro patients  XX   Pachymetry    Lissamine green or fluorescein stain of ocular surface XX   Schirmer test with anesthesia    Dilate        A-Scan with IOL calculation    B-Scan    UBM - immersion 50 Mhz         Optos wide field photography    Humphrey visual field 24-2    Specular microscopy    Confocal microscopy         Order ProKera amniotic membrane    Order amphotericin b and/or vancomycin (O'Briens)    Kontur contact lens    Betadyne rinse of fornix    ccc  No past medical history on file.  No past surgical history on file.  Family History   Problem Relation Age of Onset   ? Glaucoma Paternal Grandfather    ? Cataract Paternal Grandfather    ? Macular Degen Neg Hx    ? Retinal Detachment Neg Hx    ? Strabismus Neg Hx    ? Blindness Neg Hx    ? Amblyopia Neg Hx    ? Diabetes Neg Hx      Social History     Socioeconomic History ? Marital status: Single     Spouse name: Not on file   ? Number of children: Not on file   ? Years of education: Not on file   ? Highest education level: Not on file   Occupational History   ? Not on file   Tobacco Use   ? Smoking status: Never Smoker   ? Smokeless tobacco: Never Used   Substance and Sexual Activity   ? Alcohol use: Not on file   ? Drug use: Not on file   ? Sexual activity: Not on file   Other Topics Concern   ? Not on file   Social History Narrative   ? Not on file         Current Outpatient Medications:   ?  moxifloxacin (VIGAMOX) 0.5 % ophthalmic solution, Apply one drop to right eye as directed four times a day while awake as needed. for 1 week after surgery  Indications: pink eye from bacterial infection, Disp:  3 mL, Rfl: 1  ?  olopatadine (PATADAY) 0.2 % ophthalmic solution, Apply one drop to both eyes every morning., Disp: 2.5 mL, Rfl: 11  ?  oxyCODONE/acetaminophen (ENDOCET) 5/325 mg tablet, Take one tablet by mouth every 4 hours as needed for Pain, Disp: 20 tablet, Rfl: 0  ?  prednisolone acetate (PRED FORTE) 1 % ophthalmic suspension, 1 drop in surgical eye(s) QID X 1 week after surgery, then TID X 1 week, then BID X 1 week, then daily X 1 week, then stop  Indications: severe inflammation of the eye, Disp: 10 mL, Rfl: 1  ?  valACYclovir (VALTREX) 1 gram tablet, Take one tablet by mouth every 12 hours. Indications: prevent recurrent herpes simplex infection, Disp: 60 tablet, Rfl: 1    has No Known Allergies.    Review of Systems    Objective     Base Eye Exam     Visual Acuity    Snellen - Linear           Pupils       Dark APD    Right 4 0    Left 4 0          Visual Fields       Left Right     Full Full          Neuro/Psych    Oriented x3:  Yes  Mood/Affect:  Normal             Refraction     Wearing Rx    Type:  none                   Dendritic shaped keratitis at the apex right eye     EXAMINATION DIAGNOSIS:  1. Keratoconus, unstable, bilateral 2. 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 Dendritic keratitis          RESIDENT/FELLOW COMMENTS:  Assessment:  NA    Plan:  NA     FACULTY COMMENTS:  Assessment:  AFTER DISCUSSION WITH DR KIMPLE, HE AGREES THAT HSV SHOULD BE CONSIDERED    Plan:  MAINTAIN VALACYCLOVIR 1000 MG TWICE DAILY  RETURN ONE MONTH    Teaching Statement  I have interviewed and examined the patient and confirm the pertinent findings. I have discussed the case with the resident/fellow and agree with the findings and plan as documented.    (electronically signed)  Carron Brazen, MD  Professor, Clinical Ophthalmology  Cornea and External Diseases  [facility])  Department of Ophthalmology   [facility]  [address]  [place], [place] [address]  [phone] (phone)  [email or web address] (email)

## 2019-06-12 NOTE — Patient Instructions
EXAMINATION DIAGNOSIS:  1. Keratoconus, unstable, bilateral     2. 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 Dendritic keratitis          RESIDENT/FELLOW COMMENTS:  Assessment:  NA    Plan:  NA     FACULTY COMMENTS:  Assessment:  AFTER DISCUSSION WITH DR KIMPLE, HE AGREES THAT HSV SHOULD BE CONSIDERED    Plan:  MAINTAIN VALACYCLOVIR 1000 MG TWICE DAILY  RETURN ONE MONTH

## 2019-06-18 MED ORDER — RIBOFLAV-RIBOFL IN 20% DEXTRAN 0.146 % -0.146 % OP DVDP
1 [drp] | Freq: Once | OPHTHALMIC | 0 refills | Status: AC
Start: 2019-06-18 — End: ?
  Administered 2019-03-27: 21:00:00 1 [drp] via OPHTHALMIC

## 2019-07-06 NOTE — Progress Notes
[facility]    Encounter Date: 07/10/2019    Subjective:    Anthony Freeman is a 33 y.o. male .    Eye Problem (Pt reports OD is still irritated.  FUV since having crosslinking.  )    HPI   LAST EXAMINATION:  06/12/2019      FACULTY COMMENTS:  Assessment:  AFTER DISCUSSION WITH DR KIMPLE, HE AGREES THAT HSV SHOULD BE CONSIDERED  ?  Plan:  MAINTAIN VALACYCLOVIR 1000 MG TWICE DAILY  RETURN ONE MONTH  ?  This Visit:     I-Design 2.0 Wavescan testing (2nd floor)    Corneal topography Atlas    Corneal topography Galilei    IOL Master with Barrett formula ([id number], CZ70BD, Symphony, Panoptic)    OCT (anterior segment, cornea, macula, optic nerve) (Cirrus, [place])        Snellen vision XX   Ocular dominance determination    Manifest refraction    Cycloplegic refraction    Brightness acuity test (BAT)    Pupillary response for relative afferent pupillary defect (RAPD)    Ishihara Pseudoisochromatic plates    IOP using TonoPen as standard or Pneumotonometer in Kpro patients  XX   Pachymetry    Lissamine green or fluorescein stain of ocular surface (remove scleral lens right) xx   Schirmer test with anesthesia    Dilate        A-Scan with IOL calculation    B-Scan    UBM - immersion 50 Mhz         Optos wide field photography    Humphrey visual field 24-2    Specular microscopy    Confocal microscopy         Order ProKera amniotic membrane    Order amphotericin b and/or vancomycin (O'Briens)    Kontur contact lens    Betadyne rinse of fornix    ccc  History reviewed. No pertinent past medical history.  History reviewed. No pertinent surgical history.  Family History   Problem Relation Age of Onset   ? Glaucoma Paternal Grandfather    ? Cataract Paternal Grandfather    ? Macular Degen Neg Hx    ? Retinal Detachment Neg Hx    ? Strabismus Neg Hx    ? Blindness Neg Hx    ? Amblyopia Neg Hx    ? Diabetes Neg Hx      Social History     Socioeconomic History   ? Marital status: Single     Spouse name: Not on file ? Number of children: Not on file   ? Years of education: Not on file   ? Highest education level: Not on file   Occupational History   ? Not on file   Tobacco Use   ? Smoking status: Never Smoker   ? Smokeless tobacco: Never Used   Substance and Sexual Activity   ? Alcohol use: Not on file   ? Drug use: Not on file   ? Sexual activity: Not on file   Other Topics Concern   ? Not on file   Social History Narrative   ? Not on file         Current Outpatient Medications:   ?  moxifloxacin (VIGAMOX) 0.5 % ophthalmic solution, Apply one drop to right eye as directed four times a day while awake as needed. for 1 week after surgery  Indications: pink eye from bacterial infection, Disp: 3 mL, Rfl: 1  ?  olopatadine (PATADAY) 0.2 % ophthalmic  solution, Apply one drop to both eyes every morning., Disp: 2.5 mL, Rfl: 11  ?  oxyCODONE/acetaminophen (ENDOCET) 5/325 mg tablet, Take one tablet by mouth every 4 hours as needed for Pain, Disp: 20 tablet, Rfl: 0  ?  prednisolone acetate (PRED FORTE) 1 % ophthalmic suspension, 1 drop in surgical eye(s) QID X 1 week after surgery, then TID X 1 week, then BID X 1 week, then daily X 1 week, then stop  Indications: severe inflammation of the eye, Disp: 10 mL, Rfl: 1  ?  valACYclovir (VALTREX) 1 gram tablet, Take one tablet by mouth every 12 hours. Indications: prevent recurrent herpes simplex infection, Disp: 60 tablet, Rfl: 1    has No Known Allergies.    Review of Systems    Objective     Base Eye Exam     Visual Acuity (Snellen - Linear)       Right Left    Dist cc 20/40 20/30    Correction:  Contacts          Tonometry (Tonopen, 8:47 AM)       Right Left    Pressure 10 11          Pupils       Dark Shape    Right 4.5 Round    Left 4.5 Round          Neuro/Psych     Oriented x3:  Yes    Mood/Affect:  Normal            Slit Lamp and Fundus Exam     External Exam       Right Left    External Normal Normal          Slit Lamp Exam       Right Left    Lids/Lashes Normal Normal Conjunctiva/Sclera White and quiet, pinguecula, few fine papilla White and quiet, pinguecula, few fine papilla    Cornea Improved epi opacification/irregularity, KCN, inferior thinning KCN, inferior thinning    Anterior Chamber Deep and quiet Deep and quiet    Iris Flat Flat    Lens Clear Clear    Anterior Vitreous Normal Normal                    EXAMINATION DIAGNOSIS:  1. Keratoconus, unstable, bilateral     2. 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 I have discussed the case with the resident/fellow and agree with the findings and plan as documented.    (electronically signed)  Carron Brazen, MD  Professor, Clinical Ophthalmology  Cornea and External Diseases  [facility])  Department of Ophthalmology    Eye  [address]  [place], [place] [address]  [phone] (phone)  [email or web address] (email)

## 2019-07-09 MED ORDER — VITAMIN B2 IN 20 % DEXTRAN 0.146 % OP DRPV
1 [drp] | Freq: Once | OPHTHALMIC | 0 refills | Status: CP
Start: 2019-07-09 — End: ?

## 2019-07-10 ENCOUNTER — Encounter: Admit: 2019-07-10 | Discharge: 2019-07-10 | Payer: BC Managed Care – PPO

## 2019-07-10 ENCOUNTER — Ambulatory Visit: Admit: 2019-07-10 | Discharge: 2019-07-10 | Payer: BC Managed Care – PPO

## 2019-07-10 DIAGNOSIS — Z9889 Other specified postprocedural states: Secondary | ICD-10-CM

## 2019-07-10 DIAGNOSIS — B0052 Herpesviral keratitis: Secondary | ICD-10-CM

## 2019-07-10 DIAGNOSIS — H18623 Keratoconus, unstable, bilateral: Secondary | ICD-10-CM

## 2019-07-10 MED ORDER — VALACYCLOVIR 1 GRAM PO TAB
1000 mg | ORAL_TABLET | Freq: Two times a day (BID) | ORAL | 3 refills | Status: DC
Start: 2019-07-10 — End: 2020-01-30

## 2019-07-10 NOTE — Patient Instructions
RESIDENT/FELLOW COMMENTS:  Assessment:  Improvement in central epithelial change following CXL   - given response to valacyclovir it seems more likely now this was herpetic  Mild dry eye and allergic conjunctivitis    Plan:  Continue PO valacyclovir at 2g daily   ATs up to QID OU, Pataday qday  RTC 2 months    Richrd Prime, MD  PGY-4, Ophthalmology   Pager: [phone]

## 2019-07-25 ENCOUNTER — Ambulatory Visit: Admit: 2019-07-25 | Discharge: 2019-07-25 | Payer: No Typology Code available for payment source

## 2019-07-25 ENCOUNTER — Encounter: Admit: 2019-07-25 | Discharge: 2019-07-25 | Payer: BC Managed Care – PPO

## 2019-07-28 ENCOUNTER — Encounter: Admit: 2019-07-28 | Discharge: 2019-07-28 | Payer: BC Managed Care – PPO

## 2019-07-31 ENCOUNTER — Encounter: Admit: 2019-07-31 | Discharge: 2019-07-31 | Payer: BC Managed Care – PPO

## 2019-08-03 ENCOUNTER — Encounter: Admit: 2019-08-03 | Discharge: 2019-08-03 | Payer: BC Managed Care – PPO

## 2019-08-12 ENCOUNTER — Encounter: Admit: 2019-08-12 | Discharge: 2019-08-12 | Payer: BC Managed Care – PPO

## 2019-08-18 ENCOUNTER — Encounter: Admit: 2019-08-18 | Discharge: 2019-08-18 | Payer: BC Managed Care – PPO

## 2019-08-21 ENCOUNTER — Encounter: Admit: 2019-08-21 | Discharge: 2019-08-21 | Payer: BC Managed Care – PPO

## 2019-08-21 NOTE — Telephone Encounter
Patient sent a my chart message to Dr.T C/o eye issues possibly related to Covid (patient is positive for Covid). Dr.T advised pt to contact Dr.Goins which the patient does have an appointment on the 29 th of December but would possible like to be seen earlier. Please reach out to patient for medical advice or scheduling.

## 2019-09-02 ENCOUNTER — Encounter: Admit: 2019-09-02 | Discharge: 2019-09-02 | Payer: BC Managed Care – PPO

## 2019-09-02 NOTE — Telephone Encounter
PE spoke w pateint, he had requested an earlier visit on dec 10 and no one reached out to him. He was having issues with increase in light sensitivity and thought it ma be due to covid. He is still feeling shortness of breath, his appt is still on Tuesday dec 29

## 2019-09-09 ENCOUNTER — Encounter: Admit: 2019-09-09 | Discharge: 2019-09-09 | Payer: BC Managed Care – PPO

## 2019-09-09 ENCOUNTER — Ambulatory Visit: Admit: 2019-09-09 | Discharge: 2019-09-10 | Payer: BC Managed Care – PPO

## 2019-09-09 DIAGNOSIS — B0052 Herpesviral keratitis: Secondary | ICD-10-CM

## 2019-09-09 DIAGNOSIS — U071 COVID-19: Secondary | ICD-10-CM

## 2019-09-09 DIAGNOSIS — Z9889 Other specified postprocedural states: Secondary | ICD-10-CM

## 2019-09-09 DIAGNOSIS — H18623 Keratoconus, unstable, bilateral: Secondary | ICD-10-CM

## 2019-09-09 MED ORDER — VALACYCLOVIR 500 MG PO TAB
500 mg | ORAL_TABLET | Freq: Two times a day (BID) | ORAL | 3 refills | Status: DC
Start: 2019-09-09 — End: 2020-01-30

## 2019-09-09 NOTE — Patient Instructions
EXAMINATION DIAGNOSIS:  1. Keratoconus, unstable, bilateral     2. 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 Dendritic keratitis        FACULTY COMMENTS:  Assessment:  DENDRITIC KERATITIS RESOLVED  RECENT COVID 19 INFECTION    Plan:  CONTINUE SCLERAL LENS WEAR   REDUCE VALACYCLOVIR TO 500 MG ONCE DAILY  RETURN THREE MONTHS  DO CORNEAL TOPOGRAPHY THEN

## 2019-09-09 NOTE — Progress Notes
[facility]    Encounter Date: 09/09/2019    Subjective:    Anthony Freeman is a 33 y.o. male .    Eye Problem (FUV on HVS.  Pain OD.  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  Pt presents wearing sclerals. ) and FYI (COVID positive on 08/13/2019.  )      HPI   RESIDENT/FELLOW COMMENTS: FROM 07/10/2019  Assessment:  Improvement in central epithelial change following CXL   - given response to valacyclovir it seems more likely now this was herpetic  Mild dry eye and allergic conjunctivitis  ?  Plan:  Continue PO valacyclovir at 2g daily   ATs up to QID OU, Pataday qday  RTC 2 months  ?  Venita Lick, MD  PGY-4, Ophthalmology   Pager: [phone]    This Visit:     I-Design 2.0 Wavescan testing (2nd floor)    Corneal topography Atlas    Corneal topography Galilei    IOL Master with Barrett formula (SN60WF, CZ70BD, Symphony, Panoptic)    OCT (anterior segment, cornea, macula, optic nerve) (Cirrus, [place])        Snellen vision    Ocular dominance determination    Manifest refraction    Cycloplegic refraction    Brightness acuity test (BAT)    Pupillary response for relative afferent pupillary defect (RAPD)    Ishihara Pseudoisochromatic plates    IOP using TonoPen as standard or Pneumotonometer in Kpro patients     Pachymetry    Lissamine green or fluorescein stain of ocular surface    Schirmer test with anesthesia    Dilate        A-Scan with IOL calculation    B-Scan    UBM - immersion 50 Mhz         Optos wide field photography    Humphrey visual field 24-2    Specular microscopy    Confocal microscopy         Order ProKera amniotic membrane    Order amphotericin b and/or vancomycin (O'Briens)    Kontur contact lens    Betadyne rinse of fornix    ccc  Medical History:   Diagnosis Date   ? COVID-19 08/13/2019     History reviewed. No pertinent surgical history.  Family History   Problem Relation Age of Onset   ? Glaucoma Paternal Grandfather    ? Cataract Paternal Grandfather    ? Macular Degen Neg Hx ? Retinal Detachment Neg Hx    ? Strabismus Neg Hx    ? Blindness Neg Hx    ? Amblyopia Neg Hx    ? Diabetes Neg Hx      Social History     Socioeconomic History   ? Marital status: Single     Spouse name: Not on file   ? Number of children: Not on file   ? Years of education: Not on file   ? Highest education level: Not on file   Occupational History   ? Not on file   Tobacco Use   ? Smoking status: Never Smoker   ? Smokeless tobacco: Never Used   Substance and Sexual Activity   ? Alcohol use: Not on file   ? Drug use: Not on file   ? Sexual activity: Not on file   Other Topics Concern   ? Not on file   Social History Narrative   ? Not on file         Current Outpatient Medications:   ?  moxifloxacin (VIGAMOX) 0.5 % ophthalmic solution, Apply one drop to right eye as directed four times a day while awake as needed. for 1 week after surgery  Indications: pink eye from bacterial infection, Disp: 3 mL, Rfl: 1  ?  olopatadine (PATADAY) 0.2 % ophthalmic solution, Apply one drop to both eyes every morning., Disp: 2.5 mL, Rfl: 11  ?  oxyCODONE/acetaminophen (ENDOCET) 5/325 mg tablet, Take one tablet by mouth every 4 hours as needed for Pain, Disp: 20 tablet, Rfl: 0  ?  prednisolone acetate (PRED FORTE) 1 % ophthalmic suspension, 1 drop in surgical eye(s) QID X 1 week after surgery, then TID X 1 week, then BID X 1 week, then daily X 1 week, then stop  Indications: severe inflammation of the eye, Disp: 10 mL, Rfl: 1  ?  valACYclovir (VALTREX) 1 gram tablet, Take one tablet by mouth every 12 hours. Indications: prevent recurrent herpes simplex infection, Disp: 60 tablet, Rfl: 3    has No Known Allergies.    Review of Systems    Objective     Base Eye Exam     Visual Acuity (Snellen - Linear)       Right Left    Dist cc 20/40 -3 20/30 -1   Correction: Contacts  Vas checked with sclerals           Tonometry (Tonopen, 9:08 AM)       Right Left    Pressure 6 8          Pupils       Dark Light Shape React Right 4 2 Round Brisk    Left 4 2 Round Brisk          Neuro/Psych    Oriented x3: Yes  Mood/Affect: Normal             Slit Lamp and Fundus Exam     External Exam       Right Left    External Normal Normal          Slit Lamp Exam       Right Left    Lids/Lashes Normal Normal    Conjunctiva/Sclera White and quiet, pinguecula, few fine papilla White and quiet, pinguecula, few fine papilla    Cornea stable epi opacification/irregularity, KCN, inferior thinning - dendritic changes are gone KCN, inferior thinning    Anterior Chamber Deep and quiet Deep and quiet    Iris Flat Flat    Lens Clear Clear    Anterior Vitreous Normal Normal                  EXAMINATION DIAGNOSIS:  1. Keratoconus, unstable, bilateral     2. 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 Dendritic keratitis          RESIDENT/FELLOW COMMENTS:  Assessment:  NA    Plan:  NA     FACULTY COMMENTS:  Assessment:  DENDRITIC KERATITIS RESOLVED  RECENT COVID 19 INFECTION    Plan:  CONTINUE SCLERAL LENS WEAR   REDUCE VALACYCLOVIR TO 500 MG ONCE DAILY  RETURN THREE MONTHS  DO CORNEAL TOPOGRAPHY THEN    Teaching Statement  I have interviewed and examined the patient and confirm the pertinent findings. I have discussed the case with the resident/fellow and agree with the findings and plan as documented.    (electronically signed)  Carron Brazen, MD  Professor, Clinical Ophthalmology  Cornea and External Diseases   [facility]  Department of Ophthalmology   [facility]  [address]  [place], [place] [address]  [phone] (phone)  [email or web address] (email)

## 2019-09-18 ENCOUNTER — Encounter: Admit: 2019-09-18 | Discharge: 2019-09-18 | Payer: BC Managed Care – PPO

## 2019-09-18 NOTE — Telephone Encounter
Scheduled Anthony Freeman for appointment tomorrow at 12:45.  He reached out to me today reporting pain with OD, this is with and without his lenses on his eye.  Recommended start using AT every 1-2 hours.  If his eye is okay we can go ahead with a new fitting since the pt is in a 30 minute time slot.

## 2019-09-19 ENCOUNTER — Encounter: Admit: 2019-09-19 | Discharge: 2019-09-19 | Payer: BC Managed Care – PPO

## 2019-09-19 ENCOUNTER — Ambulatory Visit: Admit: 2019-09-19 | Discharge: 2019-09-19 | Payer: No Typology Code available for payment source

## 2019-09-19 DIAGNOSIS — U071 COVID-19: Secondary | ICD-10-CM

## 2019-10-14 ENCOUNTER — Encounter: Admit: 2019-10-14 | Discharge: 2019-10-14 | Payer: BC Managed Care – PPO

## 2019-10-24 ENCOUNTER — Ambulatory Visit: Admit: 2019-10-24 | Discharge: 2019-10-24 | Payer: No Typology Code available for payment source

## 2019-10-24 ENCOUNTER — Encounter: Admit: 2019-10-24 | Discharge: 2019-10-24 | Payer: No Typology Code available for payment source

## 2019-10-24 DIAGNOSIS — U071 COVID-19: Secondary | ICD-10-CM

## 2019-11-12 ENCOUNTER — Encounter: Admit: 2019-11-12 | Discharge: 2019-11-12 | Payer: No Typology Code available for payment source

## 2019-11-23 ENCOUNTER — Encounter: Admit: 2019-11-23 | Discharge: 2019-11-23 | Payer: No Typology Code available for payment source

## 2019-11-25 ENCOUNTER — Encounter: Admit: 2019-11-25 | Discharge: 2019-11-25 | Payer: No Typology Code available for payment source

## 2019-11-26 MED ORDER — OLOPATADINE 0.2 % OP DROP
Freq: Every morning | OPHTHALMIC | 11 refills | 27.50000 days | Status: AC
Start: 2019-11-26 — End: ?

## 2019-11-26 MED ORDER — TOBRAMYCIN-DEXAMETHASONE 0.3-0.1 % OP DRPS
1 [drp] | Freq: Four times a day (QID) | OPHTHALMIC | 0 refills
Start: 2019-11-26 — End: ?

## 2019-11-26 MED ORDER — NEOMYCIN-POLYMYXIN B-DEXAMETH 3.5MG/ML-10,000 UNIT/ML-0.1 % OP DRPS
Freq: Two times a day (BID) | 1 refills
Start: 2019-11-26 — End: ?

## 2019-12-03 ENCOUNTER — Encounter: Admit: 2019-12-03 | Discharge: 2019-12-03 | Payer: No Typology Code available for payment source

## 2019-12-03 ENCOUNTER — Ambulatory Visit: Admit: 2019-12-03 | Discharge: 2019-12-03 | Payer: No Typology Code available for payment source

## 2019-12-03 DIAGNOSIS — U071 COVID-19: Secondary | ICD-10-CM

## 2019-12-06 NOTE — Progress Notes
[facility]    Encounter Date: 12/09/2019    Subjective:    Anthony Freeman is a 34 y.o. male .    Follow Up (Presents for a 3 month FUV. Hx of keratoconus. States that his vision is the same. Vision is good in contacts. ) and FYI    HPI   FACULTY COMMENTS:  09/09/2019  Assessment:  DENDRITIC KERATITIS RESOLVED  RECENT COVID 19 INFECTION  ?  Plan:  CONTINUE SCLERAL LENS WEAR   REDUCE VALACYCLOVIR TO 500 MG ONCE DAILY  RETURN THREE MONTHS  DO CORNEAL TOPOGRAPHY THEN    This Visit:     I-Design 2.0 Wavescan testing (2nd floor)    Corneal topography Atlas    Corneal topography Galilei XX   IOL Master with Barrett formula (SN60WF, CZ70BD, Symphony, Panoptic)    OCT (anterior segment, cornea, macula, optic nerve) (Cirrus, [place])        Snellen vision XX   Ocular dominance determination    Manifest refraction    Cycloplegic refraction    Brightness acuity test (BAT)    Pupillary response for relative afferent pupillary defect (RAPD)    Ishihara Pseudoisochromatic plates    IOP using TonoPen as standard or Pneumotonometer in Kpro patients  XX   Pachymetry    Lissamine green or fluorescein stain of ocular surface    Schirmer test with anesthesia    Dilate        A-Scan with IOL calculation    B-Scan    UBM - immersion 50 Mhz         Optos wide field photography    Humphrey visual field 24-2    Specular microscopy    Confocal microscopy         Order ProKera amniotic membrane    Order amphotericin b and/or vancomycin (O'Briens)    Kontur contact lens    Betadyne rinse of fornix    ccc  Medical History:   Diagnosis Date   ? COVID-19 08/13/2019     No past surgical history on file.  Family History   Problem Relation Age of Onset   ? Glaucoma Paternal Grandfather    ? Cataract Paternal Grandfather    ? Macular Degen Neg Hx    ? Retinal Detachment Neg Hx    ? Strabismus Neg Hx    ? Blindness Neg Hx    ? Amblyopia Neg Hx    ? Diabetes Neg Hx      Social History     Socioeconomic History   ? Marital status: Single Spouse name: Not on file   ? Number of children: Not on file   ? Years of education: Not on file   ? Highest education level: Not on file   Occupational History   ? Not on file   Tobacco Use   ? Smoking status: Never Smoker   ? Smokeless tobacco: Never Used   Substance and Sexual Activity   ? Alcohol use: Not on file   ? Drug use: Not on file   ? Sexual activity: Not on file   Other Topics Concern   ? Not on file   Social History Narrative   ? Not on file         Current Outpatient Medications:   ?  moxifloxacin (VIGAMOX) 0.5 % ophthalmic solution, Apply one drop to right eye as directed four times a day while awake as needed. for 1 week after surgery  Indications: pink eye from bacterial infection,  Disp: 3 mL, Rfl: 1  ?  olopatadine (PATADAY) 0.2 % ophthalmic solution, INSTILL 1 DROP IN BOTH EYES EVERY MORNING, Disp: 2.5 mL, Rfl: 11  ?  oxyCODONE/acetaminophen (ENDOCET) 5/325 mg tablet, Take one tablet by mouth every 4 hours as needed for Pain, Disp: 20 tablet, Rfl: 0  ?  prednisolone acetate (PRED FORTE) 1 % ophthalmic suspension, 1 drop in surgical eye(s) QID X 1 week after surgery, then TID X 1 week, then BID X 1 week, then daily X 1 week, then stop  Indications: severe inflammation of the eye, Disp: 10 mL, Rfl: 1  ?  valACYclovir (VALTREX) 1 gram tablet, Take one tablet by mouth every 12 hours. Indications: prevent recurrent herpes simplex infection, Disp: 60 tablet, Rfl: 3  ?  valACYclovir (VALTREX) 500 mg tablet, Take one tablet by mouth every 12 hours. Indications: prevent recurrent herpes simplex infection, Disp: 60 tablet, Rfl: 3    has No Known Allergies.    Review of Systems    Objective     Base Eye Exam     Visual Acuity (Snellen - Linear)       Right Left    Dist cc 20/30 -1 20/25 -2   Correction: Contacts           Tonometry (Tonopen, 9:02 AM)       Right Left    Pressure 12 10          Pupils       Pupils    Right PERRL    Left PERRL          Visual Fields       Left Right     Full Full Extraocular Movement       Right Left     Full, Ortho Full, Ortho          Neuro/Psych    Oriented x3: Yes             Slit Lamp and Fundus Exam     External Exam       Right Left    External Normal Normal          Slit Lamp Exam       Right Left    Lids/Lashes Normal Normal    Conjunctiva/Sclera White and quiet, pinguecula, few fine papilla White and quiet, pinguecula, few fine papilla    Cornea no new epi defect, stable epi opacification/irregularity, KCN, inferior thinning KCN, inferior thinning    Anterior Chamber Deep and quiet Deep and quiet    Iris Flat Flat    Lens Clear Clear    Anterior Vitreous Normal Normal                  Right corneal change over time      Left corneal change over time    EXAMINATION DIAGNOSIS:  1. Keratoconus, unstable, bilateral  CORNEAL TOPOGRAPHY   2. 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 I have discussed the case with the resident/fellow and agree with the findings and plan as documented.    (electronically signed)  Carron Brazen, MD  Professor, Clinical Ophthalmology  Cornea and External Diseases  [facility])  Department of Ophthalmology   [facility]  [address]  [place], [place] [address]  [phone] (phone)  [email or web address] (email)

## 2019-12-09 ENCOUNTER — Ambulatory Visit: Admit: 2019-12-09 | Discharge: 2019-12-09 | Payer: BC Managed Care – PPO

## 2019-12-09 ENCOUNTER — Encounter: Admit: 2019-12-09 | Discharge: 2019-12-09 | Payer: No Typology Code available for payment source

## 2019-12-09 DIAGNOSIS — H18823 Corneal disorder due to contact lens, bilateral: Secondary | ICD-10-CM

## 2019-12-09 DIAGNOSIS — H1013 Acute atopic conjunctivitis, bilateral: Secondary | ICD-10-CM

## 2019-12-09 DIAGNOSIS — H18623 Keratoconus, unstable, bilateral: Secondary | ICD-10-CM

## 2019-12-09 DIAGNOSIS — Z9889 Other specified postprocedural states: Secondary | ICD-10-CM

## 2019-12-09 DIAGNOSIS — B0052 Herpesviral keratitis: Secondary | ICD-10-CM

## 2019-12-09 MED ORDER — CYCLOSPORINE 0.05 % OP DPET
1 [drp] | Freq: Two times a day (BID) | OPHTHALMIC | 4 refills | 90.00000 days | Status: AC
Start: 2019-12-09 — End: ?

## 2019-12-09 NOTE — Patient Instructions
EXAMINATION DIAGNOSIS:  1. Keratoconus, unstable, bilateral  CORNEAL TOPOGRAPHY   2. 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

## 2019-12-26 ENCOUNTER — Encounter: Admit: 2019-12-26 | Discharge: 2019-12-26 | Payer: No Typology Code available for payment source

## 2020-01-30 ENCOUNTER — Ambulatory Visit: Admit: 2020-01-30 | Discharge: 2020-01-30 | Payer: No Typology Code available for payment source

## 2020-01-30 ENCOUNTER — Encounter: Admit: 2020-01-30 | Discharge: 2020-01-30 | Payer: BC Managed Care – PPO

## 2020-01-30 DIAGNOSIS — U071 COVID-19: Secondary | ICD-10-CM

## 2020-01-30 MED ORDER — VALACYCLOVIR 500 MG PO TAB
500 mg | ORAL_TABLET | Freq: Every day | ORAL | 3 refills | Status: AC
Start: 2020-01-30 — End: ?

## 2020-05-08 NOTE — Progress Notes
[facility]ornea Clinic    Encounter Date: 05/11/2020    Subjective:    Anthony Freeman is a 34 y.o. male .    Follow Up (Pt is here for a FUV from Crosslinking OD. Pt states that he got new sclerals OU contacts. 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  Plan:  TAPER ORAL VALACYCLOVIR  -REASON UNCLEAR IF HSV AFTER CROSSLINKAGE VS MILD HYDROPS  WILL CONTINUE ORAL RIBOFLAVIN 200 TO 400 MG DAILY WITH SUNSHINE    01/30/2020 Assessment and Plan: TWARDOWSKI  ?      Problem   HSV (herpes simplex virus) keratitis, right eye   ? Post cross-linking  ?   Keratoconus, Unstable, Bilateral   ? Diagnoses in [place] 2017  CXL OD 03/27/19 a [place] with Dr Darcus Austin  HSV keratitis OD subsequent to CXL  ?   ?  Keratoconus, unstable, bilateral  Existing scleral lens wearer, we are in new fitting cycle (warranty ends May 26th)  ?  Dispensed new R lens only today, notched lenses OU               R eye: better fit, notches are good                L eye: doing well  ?  No further changes to make, recheck 6 months, sooner if problems  ?  HSV (herpes simplex virus) keratitis, right eye  Had Dr Darcus Austin look today as well  No active epithelial disease, but mild patient symptoms               Will restart valacyclovir 500 mg qday    This Visit:     I-Design 2.0 Wavescan testing (2nd floor)    Corneal topography Atlas    Corneal topography Galilei WITH DIFFERENCE MAPS FIRST TOPO VS TODAY XX   IOL Master with Barrett formula ([id number], CZ70BD, Symphony, Panoptic)    OCT (anterior segment, cornea, macula, optic nerve) (Cirrus, [place])        Snellen vision XX   Ocular dominance determination    Manifest refraction    Cycloplegic refraction    Brightness acuity test (BAT)    Pupillary response for relative afferent pupillary defect (RAPD) Ishihara Pseudoisochromatic plates    IOP using TonoPen as standard or Pneumotonometer in Kpro patients  XX   Pachymetry    Lissamine green or fluorescein stain of ocular surface    Schirmer test with anesthesia    Dilate        A-Scan with IOL calculation    B-Scan    UBM - immersion 50 Mhz         Optos wide field photography    Humphrey visual field 24-2    Specular microscopy    Confocal microscopy         Order ProKera amniotic membrane    Order amphotericin b and/or vancomycin (O'Briens)    Kontur contact lens    Betadyne rinse of fornix    ccc  Medical History:   Diagnosis Date   ? COVID-19 08/13/2019     No past surgical history on file.  Family History   Problem Relation Age of Onset   ? Glaucoma Paternal Grandfather    ? Cataract Paternal Grandfather    ?  Macular Degen Neg Hx    ? Retinal Detachment Neg Hx    ? Strabismus Neg Hx    ? Blindness Neg Hx    ? Amblyopia Neg Hx    ? Diabetes Neg Hx      Social History     Socioeconomic History   ? Marital status: Single     Spouse name: Not on file   ? Number of children: Not on file   ? Years of education: Not on file   ? Highest education level: Not on file   Occupational History   ? Not on file   Tobacco Use   ? Smoking status: Never Smoker   ? Smokeless tobacco: Never Used   Substance and Sexual Activity   ? Alcohol use: Not on file   ? Drug use: Not on file   ? Sexual activity: Not on file   Other Topics Concern   ? Not on file   Social History Narrative   ? Not on file         Current Outpatient Medications:   ?  cycloSPORINE (RESTASIS) 0.05 % ophthalmic emulsion, Apply one drop to both eyes twice daily., Disp: 60 each, Rfl: 4  ?  moxifloxacin (VIGAMOX) 0.5 % ophthalmic solution, Apply one drop to right eye as directed four times a day while awake as needed. for 1 week after surgery  Indications: pink eye from bacterial infection, Disp: 3 mL, Rfl: 1  ?  olopatadine (PATADAY) 0.2 % ophthalmic solution, INSTILL 1 DROP IN BOTH EYES EVERY MORNING, Disp: 2.5 mL, Rfl: 11  ?  oxyCODONE/acetaminophen (ENDOCET) 5/325 mg tablet, Take one tablet by mouth every 4 hours as needed for Pain, Disp: 20 tablet, Rfl: 0  ?  prednisolone acetate (PRED FORTE) 1 % ophthalmic suspension, 1 drop in surgical eye(s) QID X 1 week after surgery, then TID X 1 week, then BID X 1 week, then daily X 1 week, then stop  Indications: severe inflammation of the eye, Disp: 10 mL, Rfl: 1  ?  valACYclovir (VALTREX) 500 mg tablet, Take one tablet by mouth daily. Indications: prevent recurrent herpes simplex infection, Disp: 60 tablet, Rfl: 3    has No Known Allergies.    Review of Systems    Objective     Base Eye Exam     Visual Acuity (Snellen - Linear)       Right Left    Dist cc 20/25 -2 20/25   Correction: Contacts  New SCLERALS ou           Tonometry (Tonopen, 9:32 AM)       Right Left    Pressure 10 10          Pupils       Pupils Dark APD    Right PERRL 4 None    Left PERRL 4 None          Visual Fields       Left Right     Full Full          Extraocular Movement       Right Left     Full, Ortho Full, Ortho          Neuro/Psych    Oriented x3: Yes                           EXAMINATION DIAGNOSIS:  1. Keratoconus, unstable, bilateral  CORNEAL TOPOGRAPHY   2. History  of superficial keratectomy with riboflavin and uv light, right eye   CORNEAL TOPOGRAPHY   3. 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 Allergic conjunctivitis of both eyes  CORNEAL TOPOGRAPHY        RESIDENT/FELLOW COMMENTS:  Assessment:  NA    Plan:  NA     FACULTY COMMENTS:  Assessment:  EXCELLENT RESPONSE TO CROSSLINKAGE RIGHT  PROGRESSION OF DISEASE LEFT SINCE 2017  -CROSSLINKAGE IS INDICATED  NO SIGN OF DENDRITIC KERATITIS    Plan:  MAINTAIN ORAL RIBOFLAVIN 200 MG DAILY WITH 30 MINUTES OF SUNSHINE  VALACYCLOVIR 500 MG ONCE DAILY  RETURN SIX MONTHS    Teaching Statement  I have interviewed and examined the patient and confirm the pertinent findings. I have discussed the case with the resident/fellow and agree with the findings and plan as documented.    (electronically signed)  Carron Brazen, MD  Professor, Clinical Ophthalmology  Cornea and External Diseases  [facility])  Department of Ophthalmology   [facility]  [address]  [place], [place] [address]  [phone] (phone)  [email or web address] (email)

## 2020-05-11 ENCOUNTER — Ambulatory Visit: Admit: 2020-05-11 | Discharge: 2020-05-11 | Payer: BC Managed Care – PPO

## 2020-05-11 ENCOUNTER — Encounter: Admit: 2020-05-11 | Discharge: 2020-05-11 | Payer: BC Managed Care – PPO

## 2020-05-11 DIAGNOSIS — H18823 Corneal disorder due to contact lens, bilateral: Secondary | ICD-10-CM

## 2020-05-11 DIAGNOSIS — B0052 Herpesviral keratitis: Secondary | ICD-10-CM

## 2020-05-11 DIAGNOSIS — Z9889 Other specified postprocedural states: Secondary | ICD-10-CM

## 2020-05-11 DIAGNOSIS — H18623 Keratoconus, unstable, bilateral: Secondary | ICD-10-CM

## 2020-05-11 DIAGNOSIS — H1013 Acute atopic conjunctivitis, bilateral: Secondary | ICD-10-CM

## 2020-05-11 NOTE — Patient Instructions
EXAMINATION DIAGNOSIS:  1. Keratoconus, unstable, bilateral  CORNEAL TOPOGRAPHY   2. 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 Allergic conjunctivitis of both eyes  CORNEAL TOPOGRAPHY      RESIDENT/FELLOW COMMENTS:  Assessment:  NA    Plan:  NA     FACULTY COMMENTS:  Assessment:  EXCELLENT RESPONSE TO CROSSLINKAGE RIGHT  PROGRESSION OF DISEASE LEFT SINCE 2017  -CROSSLINKAGE IS INDICATED  NO SIGN OF DENDRITIC KERATITIS    Plan:  MAINTAIN ORAL RIBOFLAVIN 200 MG DAILY WITH 30 MINUTES OF SUNSHINE  VALACYCLOVIR 500 MG ONCE DAILY  RETURN SIX MONTHS

## 2020-05-25 ENCOUNTER — Encounter: Admit: 2020-05-25 | Discharge: 2020-05-25 | Payer: BC Managed Care – PPO

## 2020-05-26 ENCOUNTER — Encounter: Admit: 2020-05-26 | Discharge: 2020-05-26 | Payer: BC Managed Care – PPO

## 2020-06-18 ENCOUNTER — Encounter: Admit: 2020-06-18 | Discharge: 2020-06-18 | Payer: BC Managed Care – PPO

## 2020-06-21 ENCOUNTER — Encounter: Admit: 2020-06-21 | Discharge: 2020-06-21 | Payer: BC Managed Care – PPO

## 2020-06-23 ENCOUNTER — Encounter: Admit: 2020-06-23 | Discharge: 2020-06-23 | Payer: BC Managed Care – PPO

## 2020-07-26 ENCOUNTER — Encounter: Admit: 2020-07-26 | Discharge: 2020-07-27 | Payer: BC Managed Care – PPO

## 2020-07-26 NOTE — Progress Notes
Ophthalmology Minor Room/Clinic Procedure Note    Surgical Service:  Ophthalmology and Visual Sciences  Date Performed:  07/29/2020  Patient name:  Anthony Freeman  MRN:  [id number]  Attending Staff:  Camille Bal, MD  Consent Obtained:  After reviewing the potential risks and benefits as well as the performance of the procedure with the patient, an informed consent was obtained.  Procedure:  Avedro collagen crosslinkage  Laterality:  Left eye    Operative Findings (left eye):          The patient was brought to the collagen crosslinkage suite after being given Diazepam 5 mg orally for analgesia. A surgical timeout was done with the surgeon and technician staff present, so as to identify the patient and to ascertain the surgical eye being treated.  The patient was placed in the operating room chair and adjusted so that the head was in a comfortable position.   Tetracaine 1% was given topically for anesthesia. The operative eye was prepped with Betadyne 5% solution, and then draped.  A lid speculum was placed into the fornix to open the eyelids.  An Amoils scrubber 8.5 mm was used to remove the surface epithelium.  The ocular surface was rinsed with BSS.  After removing the corneal surface epithelium, the cornea was pretreated with Riboflavin Ophthalmic Solution to saturate the corneal tissue with the riboflavin photosensitizer, for approximately 20 minutes. The corneal thickness was measured and found to be approximately 400 microns.  The cornea was then irradiated with UVA (365 nm) at 3 mW/cm2 for 30 minutes for a total radiant exposure of 5.4 J/cm2.    After the procedure, topical moxifloxacin and prednisolone were applied to the fornix.  A therapeutic bandage contact lens was placed onto the cornea (Acuvue, BC 8.4, diameter 14.0 mm).    Estimated blood loss: zero    Complications:  The patient did not experience any complications    Attending Statement:  Dr. [name] was present and performed the entire procedure.    (electronically signed)  Carron Brazen, MD  Professor, Clinical Ophthalmology  Cornea and External Diseases  [facility])  Department of Ophthalmology   [facility]  [address]  [place], [place] [address]  [phone] (phone)  [email or web address] (email)

## 2020-07-27 DIAGNOSIS — Z20822 Contact with and (suspected) exposure to covid-19: Secondary | ICD-10-CM

## 2020-07-29 ENCOUNTER — Encounter: Admit: 2020-07-29 | Discharge: 2020-07-29 | Payer: BC Managed Care – PPO

## 2020-07-29 ENCOUNTER — Ambulatory Visit: Admit: 2020-07-29 | Discharge: 2020-07-29 | Payer: BC Managed Care – PPO

## 2020-07-29 DIAGNOSIS — Z9889 Other specified postprocedural states: Secondary | ICD-10-CM

## 2020-07-29 DIAGNOSIS — B0052 Herpesviral keratitis: Secondary | ICD-10-CM

## 2020-07-29 DIAGNOSIS — H18623 Keratoconus, unstable, bilateral: Secondary | ICD-10-CM

## 2020-07-29 DIAGNOSIS — H1013 Acute atopic conjunctivitis, bilateral: Secondary | ICD-10-CM

## 2020-07-29 MED ORDER — MOXIFLOXACIN 0.5 % OP DROP
1 [drp] | Freq: Four times a day (QID) | OPHTHALMIC | 1 refills | 7.00000 days | Status: AC | PRN
Start: 2020-07-29 — End: ?

## 2020-07-29 MED ORDER — PREDNISOLONE ACETATE 1 % OP DRPS
Freq: Three times a day (TID) | 1 refills | 14.00000 days | Status: AC
Start: 2020-07-29 — End: ?

## 2020-07-29 MED ORDER — OXYCODONE-ACETAMINOPHEN 5-325 MG PO TAB
1 | ORAL_TABLET | ORAL | 0 refills | 2.00000 days | Status: AC | PRN
Start: 2020-07-29 — End: ?

## 2020-08-03 ENCOUNTER — Ambulatory Visit: Admit: 2020-08-03 | Discharge: 2020-08-03 | Payer: BC Managed Care – PPO

## 2020-08-03 ENCOUNTER — Encounter: Admit: 2020-08-03 | Discharge: 2020-08-03 | Payer: BC Managed Care – PPO

## 2020-08-03 DIAGNOSIS — H18623 Keratoconus, unstable, bilateral: Secondary | ICD-10-CM

## 2020-08-03 DIAGNOSIS — H1013 Acute atopic conjunctivitis, bilateral: Secondary | ICD-10-CM

## 2020-08-03 DIAGNOSIS — Z9889 Other specified postprocedural states: Secondary | ICD-10-CM

## 2020-08-03 DIAGNOSIS — B0052 Herpesviral keratitis: Secondary | ICD-10-CM

## 2020-08-03 DIAGNOSIS — U071 COVID-19: Secondary | ICD-10-CM

## 2020-08-03 NOTE — Patient Instructions
EXAMINATION DIAGNOSIS:  1. Keratoconus, unstable, bilateral     2. 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 Dendritic keratitis     4. Allergic conjunctivitis of both eyes        RESIDENT/FELLOW COMMENTS:  Assessment:  STABLE AFTER CORNEAL COLLAGEN CROSSLIKAGE LEFT EYE FOR KERATOCONUS  EPITHELIUM HEALED    Plan:  TAPER PREDNISOLONE  THREE TIMES DAILY FOR ONE WEEK, THEN  TWO TIMES DAILY FOR ONE WEEK, THEN  ONE TIME DAILY FOR ONE WEEK, THEN  STOP  CONTINUE RIBOFLAVIN   CONTINUE VALACYCLOVIR 500 MG DAILY    STOP VIGAMOX    BCL REMOVED  RETURN 1 MONTH

## 2020-09-22 ENCOUNTER — Encounter: Admit: 2020-09-22 | Discharge: 2020-09-22 | Payer: BC Managed Care – PPO

## 2020-09-22 ENCOUNTER — Ambulatory Visit: Admit: 2020-09-22 | Discharge: 2020-09-23 | Payer: No Typology Code available for payment source

## 2020-09-22 DIAGNOSIS — U071 COVID-19: Secondary | ICD-10-CM

## 2020-10-03 NOTE — Progress Notes
Cornea Clinic    Encounter Date: 10/06/2020    Subjective:    Anthony Freeman is a 35 y.o. male .    Eye Exam (6 month FUV; Keratoconus OU), Vision Change (Pt states va is good nad he has no changes or concerns today OU. ), and Medication Update (Restasis bid OU )    HPI   Assessment and Plan: PER DR TWARDOWSKI ON 09/22/2020  ?      Problem   Keratoconus, Unstable, Bilateral   ? Diagnosed in [place] in 2017  CXL OD 03/27/19 ([facility]) -- HSV keratitis OD post CXL  CXL OS 07/29/20 -- no complications  ?   ?  ?  Keratoconus, unstable, bilateral  Scleral lens progress check today -- KCN OU post CXL OU               Vision remains good, no change post CXL OS 07/29/20               Fit (with notches) continues to work well for him, reports this may be his best pair               No changes needed at this time, recheck 6 months    This Visit: RESTAGE VISION; OKAY TO KEEP SCLERAL CONTACTS IN PLACE    I-Design 2.0 Wavescan testing (2nd floor)    Corneal topography Atlas    Corneal topography Galilei    IOL Master with Barrett formula (SN60WF, CZ70BD, Symphony, Panoptic)    OCT (anterior segment, cornea, macula, optic nerve) (Cirrus, [place])        Snellen vision XX   Ocular dominance determination    Manifest refraction    Cycloplegic refraction    Brightness acuity test (BAT)    Pupillary response for relative afferent pupillary defect (RAPD)    Ishihara Pseudoisochromatic plates    IOP using TonoPen as standard or Pneumotonometer in Kpro patients     Pachymetry    Lissamine green or fluorescein stain of ocular surface    Schirmer test with anesthesia    Dilate        A-Scan with IOL calculation    B-Scan    UBM - immersion 50 Mhz         Optos wide field photography    Humphrey visual field 24-2    Specular microscopy    Confocal microscopy         Order ProKera amniotic membrane    Order amphotericin b and/or vancomycin (O'Briens)    Kontur contact lens    Betadyne rinse of fornix      Medical History:   Diagnosis Date   ? COVID-19 08/13/2019     No past surgical history on file.  Family History   Problem Relation Age of Onset   ? Glaucoma Paternal Grandfather    ? Cataract Paternal Grandfather    ? Macular Degen Neg Hx    ? Retinal Detachment Neg Hx    ? Strabismus Neg Hx    ? Blindness Neg Hx    ? Amblyopia Neg Hx    ? Diabetes Neg Hx      Social History     Socioeconomic History   ? Marital status: Single     Spouse name: Not on file   ? Number of children: Not on file   ? Years of education: Not on file   ? Highest education level: Not on file   Occupational History   ? Not  on file   Tobacco Use   ? Smoking status: Never Smoker   ? Smokeless tobacco: Never Used   Substance and Sexual Activity   ? Alcohol use: Not on file   ? Drug use: Not on file   ? Sexual activity: Not on file   Other Topics Concern   ? Not on file   Social History Narrative   ? Not on file         Current Outpatient Medications:   ?  cycloSPORINE (RESTASIS) 0.05 % ophthalmic emulsion, Apply one drop to both eyes twice daily., Disp: 60 each, Rfl: 4  ?  moxifloxacin (VIGAMOX) 0.5 % ophthalmic solution, Apply one drop to left eye as directed four times a day while awake as needed. for 1 week after surgery, Disp: 3 mL, Rfl: 1  ?  moxifloxacin (VIGAMOX) 0.5 % ophthalmic solution, Apply one drop to right eye as directed four times a day while awake as needed. for 1 week after surgery  Indications: pink eye from bacterial infection, Disp: 3 mL, Rfl: 1  ?  olopatadine (PATADAY) 0.2 % ophthalmic solution, INSTILL 1 DROP IN BOTH EYES EVERY MORNING, Disp: 2.5 mL, Rfl: 11  ?  oxyCODONE/acetaminophen (ENDOCET) 5/325 mg tablet, Take one tablet by mouth every 6 hours as needed for Pain, Disp: 20 tablet, Rfl: 0  ?  oxyCODONE/acetaminophen (ENDOCET) 5/325 mg tablet, Take one tablet by mouth every 4 hours as needed for Pain, Disp: 20 tablet, Rfl: 0  ?  prednisolone acetate (PRED FORTE) 1 % ophthalmic suspension, 1 drop in surgical eye(s) QID X 1 week after surgery, then TID X 1 week, then BID X 1 week, then daily X 1 week, then stop  Indications: severe inflammation of the eye, Disp: 10 mL, Rfl: 1  ?  valACYclovir (VALTREX) 500 mg tablet, Take one tablet by mouth daily. Indications: prevent recurrent herpes simplex infection, Disp: 60 tablet, Rfl: 3    has No Known Allergies.    Review of Systems    Objective     Base Eye Exam     Visual Acuity (Snellen - Linear)       Right Left    Dist cc 20/30 20/25    Dist ph cc 20/25    Correction: Contacts           Pupils       Pupils Dark Shape React APD    Right PERRL 4 Round Brisk 0    Left PERRL 4 Round Brisk 0          Neuro/Psych    Oriented x3: Yes  Mood/Affect: Normal             Slit Lamp and Fundus Exam     External Exam       Right Left    External Normal Normal          Slit Lamp Exam       Right Left    Lids/Lashes Normal Normal    Conjunctiva/Sclera White and quiet White and quiet    Cornea arc shaped haze around cone; scleral lenses in place scleral lens in place    Anterior Chamber Deep and quiet Deep and quiet    Iris Flat Flat    Lens Clear Clear    Vitreous Normal Normal            Refraction     Wearing Rx    Type: none  EXAMINATION DIAGNOSIS:  1. Keratoconus, unstable, bilateral     2. 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 Dendritic keratitis     4. Allergic conjunctivitis of both eyes          RESIDENT/FELLOW COMMENTS:  Assessment:  NA    Plan:  NA    FACULTY COMMENTS:  Assessment:  STABLE AFTER COLLAGEN CROSSLINKAGE    Plan:  RETURN CORNEA IN APPROXIMATELY ONE YEAR TO RESTAGE  CONTINUE ORAL RIBOFLAVIN    Teaching Statement  I have interviewed and examined the patient and confirm the pertinent findings. I have discussed the case with the resident/fellow and agree with the findings and plan as documented.    (electronically signed)  Carron Brazen, MD  Professor, Clinical Ophthalmology  Cornea and External Diseases  [facility])  Department of Ophthalmology   [facility]  [address]  [place], [place] [address]  [phone] (phone)  [email or web address] (email)

## 2020-10-06 ENCOUNTER — Encounter: Admit: 2020-10-06 | Discharge: 2020-10-06 | Payer: BC Managed Care – PPO

## 2020-10-06 ENCOUNTER — Ambulatory Visit: Admit: 2020-10-06 | Discharge: 2020-10-07 | Payer: BC Managed Care – PPO

## 2020-10-06 DIAGNOSIS — Z9889 Other specified postprocedural states: Secondary | ICD-10-CM

## 2020-10-06 MED ORDER — VALACYCLOVIR 500 MG PO TAB
500 mg | ORAL_TABLET | Freq: Every day | ORAL | 3 refills | Status: AC
Start: 2020-10-06 — End: ?

## 2020-10-06 MED ORDER — CYCLOSPORINE 0.05 % OP DPET
1 [drp] | Freq: Two times a day (BID) | OPHTHALMIC | 4 refills | 90.00000 days | Status: AC
Start: 2020-10-06 — End: ?

## 2020-10-07 DIAGNOSIS — H18823 Corneal disorder due to contact lens, bilateral: Secondary | ICD-10-CM

## 2020-10-07 DIAGNOSIS — H1013 Acute atopic conjunctivitis, bilateral: Secondary | ICD-10-CM

## 2020-10-07 DIAGNOSIS — H18623 Keratoconus, unstable, bilateral: Secondary | ICD-10-CM

## 2020-10-07 DIAGNOSIS — B0052 Herpesviral keratitis: Secondary | ICD-10-CM

## 2020-10-19 ENCOUNTER — Encounter: Admit: 2020-10-19 | Discharge: 2020-10-19 | Payer: BC Managed Care – PPO

## 2020-12-26 ENCOUNTER — Encounter: Admit: 2020-12-26 | Discharge: 2020-12-26 | Payer: BC Managed Care – PPO

## 2021-02-07 ENCOUNTER — Encounter: Admit: 2021-02-07 | Discharge: 2021-02-07 | Payer: BC Managed Care – PPO

## 2021-02-10 ENCOUNTER — Ambulatory Visit: Admit: 2021-02-10 | Discharge: 2021-02-10 | Payer: BC Managed Care – PPO

## 2021-02-10 ENCOUNTER — Encounter: Admit: 2021-02-10 | Discharge: 2021-02-10 | Payer: BC Managed Care – PPO

## 2021-02-10 DIAGNOSIS — H18823 Corneal disorder due to contact lens, bilateral: Secondary | ICD-10-CM

## 2021-02-10 DIAGNOSIS — U071 COVID-19: Secondary | ICD-10-CM

## 2021-02-10 DIAGNOSIS — Z9889 Other specified postprocedural states: Secondary | ICD-10-CM

## 2021-02-10 DIAGNOSIS — H18623 Keratoconus, unstable, bilateral: Secondary | ICD-10-CM

## 2021-02-10 DIAGNOSIS — H1013 Acute atopic conjunctivitis, bilateral: Secondary | ICD-10-CM

## 2021-02-10 DIAGNOSIS — B0052 Herpesviral keratitis: Secondary | ICD-10-CM

## 2021-02-10 MED ORDER — TOBRAMYCIN-DEXAMETHASONE 0.3-0.1 % OP DRPS
1 [drp] | Freq: Two times a day (BID) | OPHTHALMIC | 1 refills | 7.00000 days | Status: AC
Start: 2021-02-10 — End: ?

## 2021-02-10 NOTE — Patient Instructions
EXAMINATION DIAGNOSIS:  1. Keratoconus, unstable, bilateral     2. 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 Dendritic keratitis     4. Allergic conjunctivitis of both eyes     5. 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

## 2021-02-10 NOTE — Progress Notes
Cornea Clinic    Encounter Date: 02/10/2021    Subjective:    Anthony Freeman is a 35 y.o. male .    Eye Problem (Pt presents today for red eyes that started about a week ago. He also complains that his OD is more blurry than OS. Using Restasis BID OU. ), Itchy Eye (Complains that his eyes have also been itching. ), Vision Change (Eyes are blurry and is intermediate. ), and Other (Has sclerals and wears 14 hours a day. 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 )                HPI   RED EYES  QUESTIONABLE ALLERGIES    This Visit:     I-Design 2.0 Wavescan testing (2nd floor)    Corneal topography Atlas    Corneal topography Galilei    IOL Master with Barrett formula (SN60WF, CZ70BD, Symphony, Panoptic)    OCT (anterior segment, cornea, macula, optic nerve) (Cirrus, [place])        Snellen vision    Ocular dominance determination    Manifest refraction    Cycloplegic refraction    Brightness acuity test (BAT)    Pupillary response for relative afferent pupillary defect (RAPD)    Ishihara Pseudoisochromatic plates    IOP using TonoPen as standard or Pneumotonometer in Kpro patients     Pachymetry    Lissamine green or fluorescein stain of ocular surface    Schirmer test with anesthesia    Dilate        A-Scan with IOL calculation    B-Scan    UBM - immersion 50 Mhz         Optos wide field photography    Humphrey visual field 24-2    Specular microscopy    Confocal microscopy         Order ProKera amniotic membrane    Order amphotericin b and/or vancomycin (O'Briens)    Kontur contact lens    Betadyne rinse of fornix      Medical History:   Diagnosis Date   ? COVID-19 08/13/2019     No past surgical history on file.  Family History   Problem Relation Age of Onset   ? Glaucoma Paternal Grandfather    ? Cataract Paternal Grandfather    ? Macular Degen Neg Hx    ? Retinal Detachment Neg Hx    ? Strabismus Neg Hx    ? Blindness Neg Hx    ? Amblyopia Neg Hx ? Diabetes Neg Hx      Social History     Socioeconomic History   ? Marital status: Single   Tobacco Use   ? Smoking status: Never Smoker   ? Smokeless tobacco: Never Used         Current Outpatient Medications:   ?  cycloSPORINE (RESTASIS) 0.05 % ophthalmic emulsion, Apply one drop to both eyes twice daily., Disp: 60 each, Rfl: 4  ?  moxifloxacin (VIGAMOX) 0.5 % ophthalmic solution, Apply one drop to left eye as directed four times a day while awake as needed. for 1 week after surgery, Disp: 3 mL, Rfl: 1  ?  moxifloxacin (VIGAMOX) 0.5 % ophthalmic solution, Apply one drop to right eye as directed four times a day while awake as needed. for 1 week after surgery  Indications: pink eye from bacterial infection, Disp: 3 mL, Rfl: 1  ?  olopatadine (PATADAY) 0.2 %  ophthalmic solution, INSTILL 1 DROP IN BOTH EYES EVERY MORNING, Disp: 2.5 mL, Rfl: 11  ?  oxyCODONE/acetaminophen (ENDOCET) 5/325 mg tablet, Take one tablet by mouth every 6 hours as needed for Pain, Disp: 20 tablet, Rfl: 0  ?  oxyCODONE/acetaminophen (ENDOCET) 5/325 mg tablet, Take one tablet by mouth every 4 hours as needed for Pain, Disp: 20 tablet, Rfl: 0  ?  prednisolone acetate (PRED FORTE) 1 % ophthalmic suspension, 1 drop in surgical eye(s) QID X 1 week after surgery, then TID X 1 week, then BID X 1 week, then daily X 1 week, then stop  Indications: severe inflammation of the eye, Disp: 10 mL, Rfl: 1  ?  valACYclovir (VALTREX) 500 mg tablet, Take one tablet by mouth daily. Indications: prevent recurrent herpes simplex infection, Disp: 60 tablet, Rfl: 3    has No Known Allergies.    Review of Systems    Objective     Base Eye Exam     Visual Acuity (Snellen - Linear)       Right Left    Dist cc 20/30 -1 20/20 -1   Correction: Contacts           Tonometry (Tonopen, 8:18 AM)       Right Left    Pressure 6 8          Pupils       Pupils    Right PERRL    Left PERRL          Neuro/Psych    Oriented x3: Yes             Slit Lamp and Fundus Exam     External Exam       Right Left    External Normal Normal          Slit Lamp Exam       Right Left    Lids/Lashes Normal Normal    Conjunctiva/Sclera injected 1+ injected 1+    Cornea arc shaped haze around cone;PEE PEE    Anterior Chamber Deep and quiet Deep and quiet    Iris Flat Flat    Lens Clear Clear    Vitreous Normal Normal                    EXAMINATION DIAGNOSIS:  1. Keratoconus, unstable, bilateral     2. 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 Dendritic keratitis     4. Allergic conjunctivitis of both eyes     5. 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 I have discussed the case with the resident/fellow and agree with the findings and plan as documented.    (electronically signed)  Carron Brazen, MD  Professor, Clinical Ophthalmology  Cornea and External Diseases  [facility]  [facility])  Department of Ophthalmology   [facility]  [address]  [place], [place] [address]  [phone] (phone)  [email or web address] (email)

## 2021-03-23 ENCOUNTER — Ambulatory Visit: Admit: 2021-03-23 | Discharge: 2021-03-24 | Payer: BC Managed Care – PPO

## 2021-03-23 ENCOUNTER — Encounter: Admit: 2021-03-23 | Discharge: 2021-03-23 | Payer: BC Managed Care – PPO

## 2021-03-23 DIAGNOSIS — U071 COVID-19: Secondary | ICD-10-CM

## 2021-04-15 ENCOUNTER — Encounter: Admit: 2021-04-15 | Discharge: 2021-04-15 | Payer: BC Managed Care – PPO

## 2021-04-20 ENCOUNTER — Encounter: Admit: 2021-04-20 | Discharge: 2021-04-20 | Payer: BC Managed Care – PPO

## 2021-04-27 ENCOUNTER — Encounter: Admit: 2021-04-27 | Discharge: 2021-04-27 | Payer: BC Managed Care – PPO

## 2021-04-27 ENCOUNTER — Ambulatory Visit: Admit: 2021-04-27 | Discharge: 2021-04-27 | Payer: No Typology Code available for payment source

## 2021-04-27 DIAGNOSIS — U071 COVID-19: Secondary | ICD-10-CM

## 2021-05-25 ENCOUNTER — Encounter: Admit: 2021-05-25 | Discharge: 2021-05-25 | Payer: BC Managed Care – PPO

## 2021-05-25 ENCOUNTER — Ambulatory Visit: Admit: 2021-05-25 | Discharge: 2021-05-25 | Payer: No Typology Code available for payment source

## 2021-05-25 DIAGNOSIS — H18623 Keratoconus, unstable, bilateral: Secondary | ICD-10-CM

## 2021-05-25 DIAGNOSIS — U071 COVID-19: Secondary | ICD-10-CM

## 2021-05-25 NOTE — Assessment & Plan Note
Scleral lens dispense today -- KCN OU post CXL OU   New R lens dispensed only   Better fit, temporal notch is great, nasal notch is okay   He will wear x 2 weeks and report his progress

## 2021-05-25 NOTE — Progress Notes
There is no height or weight on file to calculate BMI.              Assessment and Plan:    Problem   Keratoconus, Unstable, Bilateral    Diagnosed in [place] in 2017  CXL OD 03/27/19 ([facility]) -- HSV keratitis OD post CXL  CXL OS 96/22/29 -- no complications         Keratoconus, unstable, bilateral  Scleral lens dispense today -- KCN OU post CXL OU   New R lens dispensed only   Better fit, temporal notch is great, nasal notch is okay   He will wear x 2 weeks and report his progress            Next Optometry visit -- prn or 6 months  Test  Comments   MRx  _0     Galilei  _1     OCT  _2  Mac  _3  Nerve  _4  Ant seg    TOSM _5     Schirmer  _6  1 Drop of Proparacaine   _7  No numbing drops    IOP  _8  Tono   _9  iCare   _10  Applanate    Pachy  _11     HVF  _12  Sita Fast  _13  24-2       _14  Standard  _15  10-2         Optos Photos  _16         Dilate  _17 1 drop Tropicamide 1% &  Phenylephrine 2.5%   _18  Other (see comments)

## 2021-06-13 ENCOUNTER — Encounter: Admit: 2021-06-13 | Discharge: 2021-06-13 | Payer: BC Managed Care – PPO

## 2021-07-10 ENCOUNTER — Inpatient Hospital Stay
Admit: 2021-07-10 | Discharge: 2021-07-13 | Disposition: A | Discharge status: Home / Self Care | Attending: Student in an Organized Health Care Education/Training Program

## 2021-07-10 DIAGNOSIS — R410 Disorientation, unspecified: Secondary | ICD-10-CM

## 2021-07-10 LAB — URINALYSIS WITH REFLEX TO CULTURE
Bilirubin Urine: NEGATIVE
Blood, Urine: NEGATIVE
Glucose, Ur: NORMAL mg/dL
Ketones, Urine: NEGATIVE mg/dL
Leukocyte Esterase, Urine: NEGATIVE uL
Nitrite, Urine: NEGATIVE
Protein, Urine: NEGATIVE mg/dL
Specific Gravity, UA: 1.015 (ref 1.008–1.030)
Urobilinogen, Urine: NORMAL EU/dL
pH, Urine: 6 (ref 5.0–8.0)

## 2021-07-10 LAB — URINE DRUG SCREEN
Amphetamine, Urine: NEGATIVE
Barbiturates, Urine: NEGATIVE
Benzodiazepines, Urine: NEGATIVE
Cocaine, Urine: NEGATIVE
Methadone, Urine: NEGATIVE
Opiates, Urine: NEGATIVE
THC, TH-Cannabinol, Urine: 113 — AB

## 2021-07-10 LAB — COVID-19: SARS-CoV-2, PCR: NEGATIVE

## 2021-07-10 MED ORDER — HALOPERIDOL 5 MG PO TABS
5 MG | Freq: Four times a day (QID) | ORAL | Status: DC | PRN
Start: 2021-07-10 — End: 2021-07-11

## 2021-07-10 NOTE — Progress Notes (Signed)
Pt presents to the unit with [facility] disoriented and confused, alert to self only. Pt was escorted to room 28. Pt appears to be under the influence of a substance.     [facility] reports the pt was [place] a local [place] and crept upon a lady that was cleaning her camper in her yard. She asked the pt to leave her property and instead of leaving he moved in closer to her causing her to be scared, she called 911.    [facility] arrived on scene and upon approaching the pt he became erratic and starting running. [facility] was able to catch up to him and brought him in for an evaluation.     Pt is unable to participate in meaningful conversation, breaks out in song singing fragmented lyrics and is nonsensical.

## 2021-07-10 NOTE — ED Provider Notes (Addendum)
Chief Complaint   Patient presents with    Mental Health Problem       HPI  Patient is a 35 year old male with no known past medical history presenting with police department for behavioral health evaluation after he was displaying odd and somewhat threatening behavior earlier today, and ran from police when they tried to interview him.  Police were able to apprehend him, but he has not been able to provide any significant history.  Report is that patient was threatening we close to a woman in her yd, whose husband called police.  No reports of any overtly threatening behavior, but his behavior was seen as threatening.  On my evaluation, patient is not complaining of any discomfort, but he does not seem to be alert and oriented.  He believes he is at the hospital for COVID test, or for not cooperating.  Other than that, he is not oriented to situation.  He appears to be in no distress and endorses no medical complaints.  He denies any drug or alcohol use and denies cigarette usage.  He states that he does not use any substance because his grandmother told him not to, and that his grandmother is now awake.  His responses seem somewhat nonsensical, but also not suggestive of overt auditory or visual hallucinations.  No indication of any injury, although patient refuses to stand for my evaluation of his balance and neurologic impairment.  Patient appears to be well fed and have relatively good hygiene, and no clear indication of any drug activity.  He is unable to answer questions regarding medical history or any medications or medication compliance.  Within the context of patient not being fully oriented, he denies suicidal or homicidal ideation.  He denies AVH.        Medical History:  Current Problem List:   There is no problem list on file for this patient.      Past Medical History:  No past medical history on file.    No past surgical history on file.    Social History     Socioeconomic History    Marital status:  Single     Spouse name: Not on file    Number of children: Not on file    Years of education: Not on file    Highest education level: Not on file   Occupational History    Not on file   Tobacco Use    Smoking status: Not on file    Smokeless tobacco: Not on file   Substance and Sexual Activity    Alcohol use: Not on file    Drug use: Not on file    Sexual activity: Not on file   Other Topics Concern    Not on file   Social History Narrative    Not on file     Social Determinants of Health     Financial Resource Strain: Not on file   Food Insecurity: Not on file   Transportation Needs: Not on file   Physical Activity: Not on file   Stress: Not on file   Social Connections: Not on file   Intimate Partner Violence: Not on file   Housing Stability: Not on file       Family History:  No family history on file.    Medications:  Patient medication list reviewed  Current Discharge Medication List          Anticoagulants / Antiplatelet medications:  This patient does not have an  active medication from one of the medication groupers.    Allergies:    Patient has no known allergies.    Review of Systems   Constitutional: Negative.    HENT: Negative.     Eyes: Negative.    Respiratory: Negative.     Cardiovascular: Negative.    Gastrointestinal: Negative.    Endocrine: Negative.    Genitourinary: Negative.    Musculoskeletal: Negative.    Skin: Negative.    Allergic/Immunologic: Negative.    Neurological: Negative.    Hematological: Negative.    Psychiatric/Behavioral:  Positive for behavioral problems, confusion, self-injury and suicidal ideas. The patient is nervous/anxious.    All other systems reviewed and are negative.    ED Triage Vitals   BP Temp Temp src Pulse Resp SpO2 Height Weight   -- -- -- -- -- -- -- --     Physical Exam  Vitals and nursing note reviewed.   Constitutional:       General: He is not in acute distress.     Appearance: Normal appearance. He is normal weight. He is not ill-appearing.   HENT:      Head:  Normocephalic and atraumatic.      Right Ear: External ear normal.      Left Ear: External ear normal.      Nose: Nose normal.      Mouth/Throat:      Mouth: Mucous membranes are moist.      Pharynx: Oropharynx is clear.   Eyes:      Extraocular Movements: Extraocular movements intact.      Pupils: Pupils are equal, round, and reactive to light.   Cardiovascular:      Rate and Rhythm: Normal rate and regular rhythm.      Pulses: Normal pulses.      Heart sounds: Normal heart sounds. No murmur heard.  Pulmonary:      Effort: Pulmonary effort is normal. No respiratory distress.      Breath sounds: Normal breath sounds. No stridor. No wheezing, rhonchi or rales.   Abdominal:      General: Bowel sounds are normal. There is no distension.      Tenderness: There is no abdominal tenderness. There is no guarding.   Musculoskeletal:         General: Normal range of motion.      Cervical back: Normal range of motion. No tenderness.   Skin:     General: Skin is warm and dry.      Capillary Refill: Capillary refill takes less than 2 seconds.   Neurological:      General: No focal deficit present.      Mental Status: He is alert and oriented to person, place, and time.   Psychiatric:         Mood and Affect: Mood normal.         Behavior: Behavior normal.       Medical Decision Making:  35 year old male with no known past medical history presenting with police department for behavioral health evaluation after he was displaying odd and somewhat threatening behavior earlier today, and ran from police when they tried to interview him.  Patient is not fully oriented, but within this context, he denies any injuries or medical complaints.  Review of systems is unremarkable.  Physical exam shows no evidence of any traumatic injuries.  Available vital signs are within normal limit.  Will attempt to complete obtaining vital signs.  Will request urinalysis/UDS.  Unless there  is any further indication of substance abuse, will defer  ordering any observation for withdrawal.  Will request Behavioral Health consult.  At this time, underlying etiology behind his altered mental status is unknown.  No evidence of any trauma, so doubt TBI.  No known stroke or seizure history, and other than his confusion/disorientation, there appears to be no focal neuro deficits.  Vitals are generally within normal limit, so infection/metabolic etiology seems less likely.  Substance use history is unknown, but this remains on differential as underlying recent had his current neurologic state.    ED Course:  ED Course as of 07/13/21 0839   Sun Jul 10, 2021   1605 COVID is negative. [JS]   1658 Behavioral health staff was able to contact a family member, and margins that patient has history of poorly controlled mental health, most significantly bipolar disorder.  Per family member, there is no acute substance use history, but patient had been a smoker, quitting approximately 1 year ago.  Family member lives in [place], and is unaware that patient is here in [place].  Family member states that patient has no history of being violent or difficult to manage.  Family members unclear about patient's medication history or any compliance, but states that he thought that patient should have long-term injectable medication for his bipolar, but does not believe this has occurred.  Will continue to manage in the BED. [JS]   1906 On further reassessment, patient's mental status is slowly clearing and he is becoming more oriented.  He reports that he thinks he took an unknown substance and believes that his "brain is not right. "He continues to be in no acute distress.  Vitals remain within normal limits.  Will continue to monitor and manage in the ED. [JS]   2145 Care the patient was transitioned at start of shift.  Patient presented with likely psychosis related to his untreated bipolar disorder.  No history of drug or alcohol abuse.  Patient is awaiting further evaluation by  the psychiatric nurse practitioner. [EM]   2200 This patient has been signed out to overnight physician, Dr. Bonne Dolores, at the end of my shift.  At the time of sign-out, patient remains in the Behavioral Health Unit under observation for his altered mental status, with patient showing improvement and progressive resolution of this altered status. [JS]   Mon Jul 11, 2021   0645 Care assumed at shift change, 35 year old male waiting assessment by the psychiatric nurse practitioner for altered mental status, question psychosis.  Possible history of untreated bipolar disorder verses acute substance abuse though no report of chronic substance use.     [WC]   1634 Care of this patient is transition to me at shift change.  Patient is a 35 year old male here for behavioral health/psychosis assessment.  Full history is unknown, but high likelihood of untreated bipolar disorder, with possible underlying/superimposed substance use disorder. [JS]   2222 Care of the patient was transitioned at start of shift.  Patient presented with psychosis likely in the setting of untreated bipolar disorder.  Inpatient bed search is underway. [EM]   Tue Jul 12, 2021   1248 Signed out to me at change of shift. He presented with psychosis in the setting of untreated bipolar disorder. Bed search underway [JL]   1507 Bipolar psychosis.  Bed search. [SP]   2101 Patient taken sign out at change of shift.  Has untreated bipolar disorder and presented with psychosis.  Bed search. [KM]   Wed Jul 13, 2021   0446 The patient's care was or is anticipated to be transferred to me at change of shift.  Presented manic from bipolar disorder.  Bed search underway for continued inpatient stabilization.  The patient is voluntary, but reassessment is recommended if patient requests discharge. Any new events/issues will be reported as they occur. [CS]   0450 Disposition is pending at change of shift.  Care is signed out to Dr. Opal Sidles. [KM]   [phone] The patient was  re-assessed by the psych NP who feels that he has stabilized enough to ensure safe discharge.   [CS]      ED Course User Index  [CS] Molly Maduro, MD  [EM] Aldean Ast, DO  [JL] Michaelle Copas, MD  [JS] Charyl Bigger, MD  [KM] Graylin Shiver, MD  [SP] Johnna Acosta, MD  [WC] Rigoberto Noel, MD       Procedures    Vital Signs for this visit:  Vitals:    07/10/21 1400 07/11/21 0800   BP: 115/70 112/70   Pulse:  81   Resp: 20 18   Temp:  98.1 ??F (36.7 ??C)   TempSrc:  Oral   SpO2: 96% 97%       Lab findings during this visit (only abnormal values will be noted, if no value noted then the result was normal range):  Labs Reviewed   URINE DRUG SCREEN - Abnormal; Notable for the following components:       Result Value    THC, TH-Cannabinol, Urine 113 (*)     All other components within normal limits   CBC WITH AUTO DIFFERENTIAL - Abnormal; Notable for the following components:    MCH 31.5 (*)     All other components within normal limits   COMPREHENSIVE METABOLIC PANEL - Abnormal; Notable for the following components:    Glucose 134 (*)     All other components within normal limits   URINALYSIS WITH REFLEX TO CULTURE   COVID-19   THYROID CASCADE PROFILE       Radiology studies during this visit and the past 24 hours:  No results found.    Medications given in the ED:  Medications   nicotine polacrilex (NICORETTE) gum 2 mg (2 mg Oral Given 07/12/21 1308)   OLANZapine (ZYPREXA) tablet 7.5 mg (7.5 mg Oral Given 07/12/21 2117)   OLANZapine (ZYPREXA) tablet 5 mg (5 mg Oral Given 07/11/21 1220)   diphenhydrAMINE (BENADRYL) injection 50 mg (has no administration in time range)     Or   diphenhydrAMINE (BENADRYL) tablet 50 mg (has no administration in time range)   LORazepam (ATIVAN) injection 2 mg ( IntraMUSCular See Alternative 07/12/21 1713)     Or   LORazepam (ATIVAN) tablet 2 mg (2 mg Oral Given 07/12/21 1713)   OLANZapine (ZYPREXA) injection 10 mg ( IntraMUSCular See Alternative  07/11/21 1612)     Or   OLANZapine zydis (ZYPREXA) disintegrating tablet 10 mg (0 mg Oral Return to [facility] 07/11/21 1612)   OLANZapine zydis (ZYPREXA) disintegrating tablet 5 mg (5 mg Oral Given 07/11/21 0059)       Diagnosis:  1. Confusion    2. Encounter for behavioral health screening    3. Alcohol dependence with withdrawal with complication (HCC)    4. Bipolar disorder, current episode manic without psychotic features, severe (HCC)            Condition at disposition:  stable    DISPOSITION Decision To  Discharge 07/13/2021 08:39:28 AM      Discharge prescriptions and/or changes if applicable:       Medication List        CHANGE how you take these medications      OLANZapine 7.5 MG tablet  Commonly known as: ZYPREXA  Take 1 tablet by mouth nightly  What changed:   when to take this  additional instructions               Where to Get Your Medications        These medications were sent to [facility] [place], [place] - [address] - P [phone] Carmon Ginsberg [phone]  [address], [place] [address]      Phone: [phone]   OLANZapine 7.5 MG tablet         Follow-up if applicable:  No follow-up provider specified.    Please note that portions of this document were created using the M*Modal Fluency Direct dictation system.  Any inconsistencies or typographical errors may be the result of mis-transcription that persist in spite of proof-reading and should be addressed with the document creator.       Rigoberto Noel, MD  07/11/21 1026       Rigoberto Noel, MD  07/11/21 1027       Rigoberto Noel, MD  07/11/21 1028       Michaelle Copas, MD  07/12/21 1249       Molly Maduro, MD  07/13/21 [phone]

## 2021-07-10 NOTE — ED Notes (Signed)
Unable to tell me where he comes from. Says "I don't remember, Can I think about it?" Later says he is homeless right now. Says he picks his meds from [facility] but couldn't tell the particular one     Francie Massing, RN  07/10/21 2331

## 2021-07-10 NOTE — Progress Notes (Signed)
Clearing CVO Following Provider Consultation    Patient placed on CVO upon arrival to unit. Case reviewed with Provider, Rodell Perna NP, who feels pt is safe for a lower level of observation. Pt transitioned to q15 min reassurance checks at time of provider's order.    This assessment takes into consideration the patient being within a controlled environment with the following standard mitigation strategies: Management of sharps, regular environmental assessment for safety of the unit, patient search upon admission with removal of unsafe objects, and mitigation of ligature risk including both environmental limitations and limitations of unsafe objects including shoelaces, belts, clothing with cords or drawstrings.

## 2021-07-10 NOTE — ED Notes (Signed)
Pt reports he doesn't take haldol. Says he takes "7.5 mg of zyprexa every other day but I don't need it at night"     Francie Massing, RN  07/10/21 2322

## 2021-07-10 NOTE — Progress Notes (Signed)
Patient received a standard search upon admission to Behavioral Emergency Department with removal of unsafe objects and ligature risks including but not limited to shoes, belts, clothing with cords or drawstrings, and jewelry. Patient search was completed per unit process by Kathlyn Sacramento and observed by Seward Speck and [employer].

## 2021-07-10 NOTE — Progress Notes (Signed)
Pt is slowly being able to answer basic assessment questions. Pt reports he took an unknown substance and is brain is not right. Pt given snack this afternoon, positive support and reassurance. Pt is calm and easy to redirect.

## 2021-07-10 NOTE — ED Notes (Signed)
Eddie Morrison gave verbal consent to speak to his father Eddie Morrison [phone]. Father lives in [place].  Per father they have had a rocky relationship and Eddie Morrison suffers from Bipolar. Eddie Morrison is not always complaint with medications and has struggled with mental health. Father reports that Eddie Morrison is not known to use illicit drugs nor does he smoke.He states that Eddie Morrison came to [facility] and was staying with his nephew (fathers nephew) but he is unsure why. He is unaware of any allergies. He can be reached for any questions but does not know of what medications Eddie Morrison should be on. He does report that Eddie Morrison was extensively involved with the dept of mental [facility] of [place].        Eddie Dieguez L. Benay Spice, RN  07/10/21 [phone]

## 2021-07-10 NOTE — ED Notes (Addendum)
Pt appeared disorganized when woken up. Spinned on the floor,a handstand and did not seem to comprehend what was being told him. Pt was supposed to see the psych NP but this was not done due to pt's behavior. Pt sat back in the recliner and appeared to be sleeping.  Appeared paranoid when woken up again to take a med. Pt refused to take a prn and appears to be sleeping at this time. Will continue to monitor.  Hourly rounds have been maintained per flowsheet documentation.       Francie Massing, RN  07/10/21 2130       Francie Massing, RN  07/10/21 2131

## 2021-07-11 LAB — COMPREHENSIVE METABOLIC PANEL
ALT: 29 U/L (ref 12–78)
AST: 32 U/L (ref 10–37)
Albumin: 3.5 g/dL (ref 3.4–5.0)
Alk Phosphatase: 69 U/L (ref 43–117)
BUN: 11 MG/DL (ref 7–22)
CO2: 30 mmol/L (ref 21–32)
Calcium: 9.5 MG/DL (ref 8.5–10.1)
Chloride: 108 mmol/L (ref 98–108)
Creatinine: 1.28 MG/DL (ref 0.70–1.30)
Est, Glom Filt Rate: >60 mL/min/{1.73_m2} (ref >60)
Glucose: 134 mg/dL — ABNORMAL HIGH (ref 74–106)
Potassium: 3.8 mmol/L (ref 3.4–5.1)
Sodium: 142 mmol/L (ref 136–145)
Total Bilirubin: 0.5 mg/dL (ref 0.00–1.00)
Total Protein: 6.9 g/dL (ref 6.4–8.2)

## 2021-07-11 LAB — CBC WITH AUTO DIFFERENTIAL
Basophils %: 1 % (ref 0–2)
Basophils Absolute: 0 10*3/uL (ref 0.0–0.1)
Eosinophils %: 2 % (ref 0–5)
Eosinophils Absolute: 0.2 10*3/uL (ref 0.0–0.5)
Hematocrit: 47.4 % (ref 42.0–52.0)
Hemoglobin: 15.9 g/dL (ref 14.0–18.0)
Immature Granulocytes %: 0 % (ref 0.0–0.6)
Immature Granulocytes Absolute: 0 10*3/uL (ref 0.00–0.04)
Lymphocytes Absolute: 1.5 10*3/uL (ref 1.3–3.6)
Lymphocytes: 23 % (ref 14–46)
MCH: 31.5 PG — ABNORMAL HIGH (ref 27.0–31.0)
MCHC: 33.5 g/dL (ref 33.0–37.0)
MCV: 93.9 FL (ref 80.0–94.0)
MPV: 9.4 FL (ref 7.4–10.4)
Monocytes %: 12 % (ref 5–12)
Monocytes Absolute: 0.8 10*3/uL (ref 0.3–0.8)
Neutrophils Absolute: 3.8 10*3/uL (ref 1.6–6.1)
Nucleated RBCs: 0 PER 100 WBC
Platelets: 227 10*3/uL (ref 130–400)
RBC: 5.05 M/uL (ref 4.70–6.10)
RDW: 13.6 % (ref 11.5–14.5)
Seg Neutrophils: 62 % (ref 47–80)
WBC: 6.2 10*3/uL (ref 4.5–10.9)
nRBC: 0 10*3/uL

## 2021-07-11 LAB — THYROID CASCADE PROFILE: TSH, 3rd Generation: 0.571 u[IU]/mL (ref 0.358–3.740)

## 2021-07-11 MED ORDER — OLANZAPINE 5 MG PO TBDP
5 MG | Freq: Once | ORAL | Status: AC | PRN
Start: 2021-07-11 — End: 2021-07-11
  Administered 2021-07-11: 05:00:00 5 mg via ORAL

## 2021-07-11 MED ORDER — DIPHENHYDRAMINE HCL 25 MG PO TABS
25 MG | ORAL | Status: DC | PRN
Start: 2021-07-11 — End: 2021-07-13

## 2021-07-11 MED ORDER — OLANZAPINE 10 MG IM SOLR
10 MG | INTRAMUSCULAR | Status: AC | PRN
Start: 2021-07-11 — End: 2021-07-13

## 2021-07-11 MED ORDER — DIPHENHYDRAMINE HCL 50 MG/ML IJ SOLN
50 MG/ML | INTRAMUSCULAR | Status: DC | PRN
Start: 2021-07-11 — End: 2021-07-13

## 2021-07-11 MED ORDER — OLANZAPINE 2.5 MG PO TABS
2.5 MG | Freq: Every evening | ORAL | Status: AC
Start: 2021-07-11 — End: 2021-07-13
  Administered 2021-07-12 – 2021-07-13 (×2): 7.5 mg via ORAL

## 2021-07-11 MED ORDER — OLANZAPINE 10 MG PO TBDP
10 MG | ORAL | Status: DC
Start: 2021-07-11 — End: 2021-07-11

## 2021-07-11 MED ORDER — OLANZAPINE 10 MG PO TBDP
10 MG | ORAL | Status: AC | PRN
Start: 2021-07-11 — End: 2021-07-13

## 2021-07-11 MED ORDER — OLANZAPINE 5 MG PO TABS
5 MG | Freq: Four times a day (QID) | ORAL | Status: AC | PRN
Start: 2021-07-11 — End: 2021-07-13
  Administered 2021-07-11: 16:00:00 5 mg via ORAL

## 2021-07-11 MED ORDER — LORAZEPAM 1 MG PO TABS
1 MG | ORAL | Status: AC | PRN
Start: 2021-07-11 — End: 2021-07-13
  Administered 2021-07-12: 21:00:00 2 mg via ORAL

## 2021-07-11 MED ORDER — NICOTINE POLACRILEX 2 MG MT GUM
2 MG | OROMUCOSAL | Status: AC | PRN
Start: 2021-07-11 — End: 2021-07-13
  Administered 2021-07-12 (×2): 2 mg via ORAL

## 2021-07-11 MED ORDER — LORAZEPAM 2 MG/ML IJ SOLN
2 MG/ML | INTRAMUSCULAR | Status: AC | PRN
Start: 2021-07-11 — End: 2021-07-13

## 2021-07-11 MED FILL — OLANZAPINE 5 MG PO TABS: 5 MG | ORAL | Qty: 1

## 2021-07-11 MED FILL — OLANZAPINE 10 MG PO TBDP: 10 MG | ORAL | Qty: 1

## 2021-07-11 MED FILL — HALOPERIDOL 5 MG PO TABS: 5 MG | ORAL | Qty: 1

## 2021-07-11 MED FILL — OLANZAPINE 5 MG PO TBDP: 5 MG | ORAL | Qty: 1

## 2021-07-11 MED FILL — LORAZEPAM 1 MG PO TABS: 1 MG | ORAL | Qty: 2

## 2021-07-11 MED FILL — DIPHENHYDRAMINE HCL 25 MG PO TABS: 25 MG | ORAL | Qty: 2

## 2021-07-11 NOTE — Consults (Signed)
BEHAVIORAL HEALTH PSYCHIATRIC EVALUATION     NAME:  Eddie Morrison  DOB:     1986/07/15  AGE:     35 y.o.  SEX:      Male  MR#:      [id number]      CHIEF COMPLAINT/REASON FOR CONSULT: "I am not sure"    HISTORY OF PRESENT ILLNESS:   Patient is a 35 y.o. male who presented to the emergency department with police after he was found in the community acting in a bizarre manner.  Police reports that he was found standing in a woman's yd while she was cleaning out a camper.  He was noted to move closer to the woman in a bizarre and threatening manner.  She asked the patient to leave however he was disorganized and unable to comply with request.  Police were called and when they arrived the patient ran.  They were able to apprehend the patient and bring him here for evaluation.  He was noted to be extremely disorganized, unable to answer questions and at times working out and to songs singing fragmented lyrics that were nonsensical.  We were able to speak with the patient's father, Windy Fast, who lives in [place].  He reported that they have a rocky relationship and that the patient has been diagnosed with bipolar disorder however has not always been compliant with his medications and has struggled with his mental health for quite some time.  Father stated age he does not often use illegal substances.  He states that he came to [place] to stay with a cousin however is not sure if this has been happening.  Dad does report that the patient had been involved extensively with the mental health services in [facility] and they were working on getting him on a long-acting injectable at point.  Patient was monitored overnight, he was given Zyprexa which he reported he took on a nightly basis.  This morning he was agreeable to assessment.      Patient states that he is unsure as to how he arrived at the emergency department and is still confused as to where he is at times.  He states that he was living in [place] and has  been staying in [place] but reports he is homeless.  He states he was hanging out with a cousin however is not sure where he is.  He talks about a rosary that he was wearing and then goes on to talk about a fight at a grocery store.  He continues to state that he does not know what is going on.  He denies any suicidal or homicidal ideation intent or plan.  He denies auditory visual hallucinations but does state that he is paranoid about things.     PSYCHIATRIC REVIEW OF SYSTEMS:     Thoughts of Self Harm: no  Thoughts of Harming Others: no  Hallucinations:  Denies but does report paranoid  Delusions: no  Obsessions/Compulsions: no  Anxiety: yes   Change in Attention/Concentration: yes - disorganized at times  Loss of interest in normal activities: no  Hopeless/Helpless/Worthlessness: no  Change in Sleep:  Patient is unable to state what his recent sleep patterns have been  Change in Appetite: no    PAST PSYCHIATRIC HISTORY:   Patient has past diagnoses of bipolar disorder.    He states he does have a provider named Cindi Carbon in [place] that he sees over telehealth every 3 months.  He has on clear as to where  this provider is located  He reports he has been hospitalized in the past in [place] and in [place] however is unclear when the last time was.  He denies suicide attempts, self injuries behavior, eating disorder or violence.  He reports he is currently taking Zyprexa and has been on multiple other medications including lithium however is unable to recall past medications.  Father reported that the patient has had act level of care in the past with a were looking to get him on long-acting injectable medication.    CHEMICAL DEPENDENCY HISTORY:  Patient denies any regular illicit substance use.    He does report occasional alcohol use however is unclear when his last use was.  Denies that he uses daily.  Denies opiate, cocaine, methamphetamine use.    He does report occasional marijuana use and is  unable to clarify when last use was.    States he has not been to detox treatment in the past  Denies withdrawal seizures or DTs    TOBACCO:   smokes half PPD    FAMILY PSYCHIATRIC HISTORY:  ???my whole family???.    SOCIAL HISTORY:  Social History     Socioeconomic History    Marital status: Single       Additional Social History:  Patient reports he was born in [place].  He completed high school in 2005 and went on to [employer] where he states he was taking classes however did not complete.  He has worked in a bank in the past and has also worked in [employer] traffic [employer].  He states he was living in [place], recently moved to [place].  When asked if he has ever been married or had children he states he is unable to recall.  He reports some half misdemeanor charges however not been charged with any felonies in the past.    History of Trauma:  Patient states he is unable to recall at this time    MEDICAL HISTORY:  Primary Care: None Provider  Medical Problems:  There is no problem list on file for this patient.      Surgical HX:  No past surgical history on file.    Allergies: No Known Allergies    MENTAL STATUS EXAM:  General Appearance:shows no evidence of impairment  Speech:normal rate, tone and volume  Language: Patient   Does not have  problems with word finding or naming.  Psychomotor: No abnormal movements or rigidity  Affect: Appropriate  Thought Process:tangential connections, loose associations, flight of ideas  Associations: loose  Thought Content:  Paranoid  Suicidal Thoughts: None  Homicidal/Violent Thoughts: None  Memory (Short and Long term): short term memory loss  Orientation:not oriented to place, time/date, and situation  Attention:  Intact  Fund of Knowledge: Intact  Insight/Judgement:fair  Estimated Intelligence:Average    Patient does have capacity to participate in their treatment plan.    ASSESSMENT:  Patient is a 35 y.o. male who presents emergency department the context of bizarre behavior  in the community.  Patient is new to the area and history is limited.  He does have a history of bipolar disorder and states he has not been taking his medication regularly.  Patient is disorganized, has some memory loss as to what has happened in the recent past days.  Patient has been restarted on Zyprexa 7.5 which he states he takes in the past and has been helpful.  He is slightly more organized today however continues to remain tangential with some flight of  ideas.  Patient is voluntary for hospitalization and it is possible that with continued medication compliance the patient will stabilize.  At this time the patient will be presented for bed search.  If the patient were to ask for discharge patient would not need GI see due to disorganization.  Would recommend re-evaluation in 24 hours as the patient continues to clear.    DIAGNOSIS:  Bipolar disorder currently manic severe    PLAN:    Continue with olanzapine 7.5 mg daily.   Present for inpatient bed search, patient is currently voluntary  Will order baseline labs including CBC, CMP and TSH  Based on available clinical information the patient does not currently require constant visual observation.     Recommendations were reviewed with attending provider Lauretta Grill MD who was agreeable with plan.

## 2021-07-11 NOTE — ED Notes (Signed)
Patient remains disorganized. Eddie Morrison did have a period of agitation today concerning discharging from here, however, after some counseling and redirection he agreed to take some space in a room and emerged after a period in better behavioral control.       Hourly rounds maintained per flowsheet documentation.     Iline Oven, RN  07/11/21 [phone]

## 2021-07-11 NOTE — Discharge Instructions (Addendum)
Your olanzapine was sent to the [facility] on [address], please pick it up and continue taking it.  Review provided shelter information.    Please return to the nearest ED with any safety concerns or significant worsening of symptoms.   Crisis is available at anytime by calling [phone].  The warm line is available to offer peer-support services at 7160620423 or 502-281-2690  The National Crisis TEXT line: Text HOME to 403-853-2222  The National Suicide Prevention Line is available at 1-800-273-TALK 469-011-8380).  [place]'s own informational hotline is reachable at 211 and can offer information about a full range of community resources.  The Alcoholics Anonymous information line is [phone]

## 2021-07-11 NOTE — ED Notes (Signed)
Patient was in room 28 earlier resting.  He came into the milieu and was cooperative.  He is still confused at times.  He has asked for many snacks stating he is constantly hungry.  Snacks provided and now the patient is asleep in the recliner.    Hourly rounds have been maintained per flowsheet documentation.           Wayland Denis, RN  07/11/21 [phone]

## 2021-07-11 NOTE — Progress Notes (Signed)
A search for availability of an inpatient bed was completed on the patient's behalf at the following facilities:    [facility] Admit to C4 when a bed becomes available   [facility] No availability reported today   [facility] No availability reported today   [facility] No availability reported today   [facility] No availability reported today   [facility] No availability reported today   [facility] No availability reported today   [facility] Not called d/t patient's INV status

## 2021-07-12 MED FILL — LORAZEPAM 1 MG PO TABS: 1 MG | ORAL | Qty: 2

## 2021-07-12 MED FILL — NICOTINE POLACRILEX 2 MG MT GUM: 2 MG | OROMUCOSAL | Qty: 1

## 2021-07-12 MED FILL — OLANZAPINE 2.5 MG PO TABS: 2.5 MG | ORAL | Qty: 1

## 2021-07-12 NOTE — ED Notes (Signed)
Patient back at the desk.  He is asking for a snack but it takes him several different ways to ask.  He is very confused.  Patient was given a snack and ginger ale.  He was happy.    Hourly rounds have been maintained per flowsheet documentation.       Wayland Denis, RN  07/12/21 [phone]

## 2021-07-12 NOTE — ED Notes (Signed)
Patient finally sat down in the recliner in the milieu and fell asleep.    Hourly rounds have been maintained per flowsheet documentation.       Wayland Denis, RN  07/12/21 [phone]

## 2021-07-12 NOTE — ED Notes (Signed)
Patient is back at the desk.  He is asking questions about his belongings and how he got here etc.  When mentioning his dad Windy Fast he said that he doesn't know anything about what he is going through.  He has talked to his mom and was able to tell me her phone number.It actually was correct.  Patient states he can't stay here if he can't have fun.  He is wanting to be able to go outside and enjoy himself.  He states he can't be locked up for days and weeks as he has plans to go somewhere.  He couldn't say where.  He all of a sudden did a spin around with his body and this nurse heard him say let me go.  He was looking at the ceiling and it was like he was seeing things.  Patient asked if we were "all good" and walked away eating his snack.    Hourly rounds have been maintained per flowsheet documentation.       Wayland Denis, RN  07/12/21 [phone]

## 2021-07-12 NOTE — ED Notes (Signed)
Patient has been up and down since last documentation.  He has been at the desk asking questions and getting agitated.  Then he paces around the milieu until he finds another spot to sit.  He is redirectable but still very confused in mentation.    Hourly rounds have been maintained per flowsheet documentation.       Wayland Denis, RN  07/12/21 [phone]

## 2021-07-12 NOTE — ED Notes (Addendum)
Patient has been cooperative this evening. Patient approached the desk and was seen with a rosary around his wrist. Patient states the priest came and saw him today and gave it to him. Rosary has been placed in patient belongings for safety. Security made aware of situation. Patient has been in good behavioral control this evening. Will continue to monitor. Hourly rounds have been maintained per flowsheet documentation.            Sander Radon, RN  07/12/21 [phone]

## 2021-07-12 NOTE — ED Notes (Signed)
Bed search      [facility] At capacity this evening.   [facility] No available beds.   [facility] No available beds.   [facility] No available beds.   [facility] No available beds.   [facility] No available beds.   [facility] Admissions closed at this time.   [facility] Not contacted at this time, on EI status.

## 2021-07-12 NOTE — Progress Notes (Signed)
A search for availability of an inpatient bed was completed on the patient's behalf at the following facilities:     [facility] Admit to C4 when a bed becomes available   [facility] No availability reported today   [facility] No availability reported today   [facility] No availability reported today   [facility] No availability reported today   [facility] No availability reported today   [facility] No availability reported today   [facility] Not called d/t lack of transport resources to complete transfer.         The following was forwarded as a presentation of the patient for review and consideration for admission:     Patient Demographics Information              Hospital Account              Guarantor Account              Insurance Coverage Info              Diagnosis Info              Admission Info     Encounter Summary Report              Primary Care Provider Info              Core Measures              Arrival Information              Infection Status              Diagnosis              ED Triage Notes              ED Notes              Home Medications              ED Prescriptions              ED Orders              Allergies              LAB Results     Behavioral Health Consultation              History of Present Illness              Past Psychiatric History              Chemical Dependence History              Family Psychiatric History              Social History / Trauma History              Current Facility Administered Medications              Current Outpatient Medications              Mental Status Exam              Assessment / Diagnosis / Recommendations

## 2021-07-12 NOTE — ED Notes (Signed)
Patient awake, asking to speak to this RN. Patient states he doesn't understand what happens "here". Patient states he is unsure of why he came here but he wants to do the right things to get out. Patient showed this RN his knees which have multiple healing wounds. Patient states they hurt and was agreeable to trial an ice pack. Patient also asked about his belongings. Reassurance provided. Asked Sharl Ma, Security about footage to see what patient had when he came in. Patient to be updated.  Patient allows this RN to look at notes to update him as to how he got here and whats next.     This RN gathered info for patient and updated him as to how he got here and the next steps. Patient was offered dad's number to gather information from him also. Patient is calm and asks appropriate questions. Patient seems genuinely concerned about the past events leading him to the BED. Patient updated on security footage and was reassured that Sharl Ma, Security was agreeable to look at his belongings to verify since the camera footage was not working appropriately. Will continue to monitor.     Hourly rounds have been maintained per flowsheet documentation.       Cristopher Estimable, RN  07/12/21 [phone]

## 2021-07-12 NOTE — ED Notes (Signed)
Patient is getting very agitated.  He doesn't know where he is at or what he is doing here.  He stepped away from the desk, walked to his room and slammed the door.  Then he came back out with his shirt off.  He said a few things that were incoherent and went back to his room.  He came out again with his shirt on. He is pacing and giving me "looks".  Security is on the floor just in case.         Wayland Denis, RN  07/12/21 [phone]

## 2021-07-12 NOTE — ED Notes (Signed)
Patient just came to the desk.  He is very confused.  This nurse asked what he needed.  His comment was, "there is a difference between needs and wants."  This nurse called his name and he pointed his fingers like"oh, yeah."  He then asked where he was and asked how old he was.  This information was given to him and he said, "so things are getting worked out?"  This nurse answered, "yes."  He asked for a sandwich which he was told we had none and then walked away from the desk saying thank you.    Hourly rounds have been maintained per flowsheet documentation.       Wayland Denis, RN  07/12/21 [phone]

## 2021-07-12 NOTE — ED Notes (Signed)
Patient has had a good day. Patient has been able to make phone calls with staff assistance and makes needs known. Patient was agreeable to PRNs when another peer was making him agitated. Patient does need reminding when he is getting increased anxiety about why he is here and what the next steps are. Patient is able to calm during redirection and reminding. Patient awaiting dinner. Will continue to monitor.     Hourly rounds have been maintained per flowsheet documentation.       Cristopher Estimable, RN  07/12/21 [phone]

## 2021-07-12 NOTE — ED Notes (Signed)
Patient requested a "blessed rosary" to have. Jess, RN called to pastoral services. Pastoral services came in with two different options they had available. Patient was given the plastic option. This RN educated patient that he needed to have the rosary under his clothes and be appropriate/safe with it or it will be placed in his belongings for safe keeping due to bx. Patient agreeable. Will continue to monitor.     Hourly rounds have been maintained per flowsheet documentation.       Cristopher Estimable, RN  07/12/21 [phone]

## 2021-07-13 MED ORDER — OLANZAPINE 7.5 MG PO TABS
7.5 MG | ORAL_TABLET | Freq: Every evening | ORAL | 0 refills | Status: AC
Start: 2021-07-13 — End: ?

## 2021-07-13 MED FILL — OLANZAPINE 2.5 MG PO TABS: 2.5 MG | ORAL | Qty: 1

## 2021-07-13 NOTE — Consults (Signed)
DATE:   07/13/2021  NAME:  Eddie Morrison  DOB:     1986-07-04  AGE:     35 y.o.  SEX:      Male  MR#:      [id number]         SUBJECTIVE    CHIEF COMPLAINT/REASON FOR VISIT: Disorganized behavior in the community  Primary Diagnosis: Bipolar Disorder     INTERVAL HISTORY OF PRESENT ILLNESS:   Patient is a 35 y.o. male who presented to the ED with police over over 2 days ago with report of disorganized erratic behavior in the community. The patient was evaluated 10/31 by Rodell Perna PMHNP with recommendation made for inpatient admission. Patient was not agreeable to recommendation for admission so EIC was initiated. Patient was restarted on outpatient regime of olanzapine 7.5mg  PO QHS which patient has remained adherent with here in the BED. This morning the patient is requesting discharge, is agreeable to meet with provider. Interview conducted via secure videoconferencing, patient agreeable. The patient reports limited recollection of events on date of admission. He reports he recalls being brought in by the police, recalls the police approaching him in the community and saying he was causing a disturbance on peoples lawns. He reports he does not recall causing any sort of disturbances. He reports he felt that people were acting oddly towards him, did not feel he was behaving strangely. He does report he had been using [facility], also notes he had not had his olanzapine as he had left it behind in [place]. He reports a history of Bipolar disorder, indicates he is agreeable to remain on his usual dosing of olanzapine. He denies any suicidal or homicidal ideation. Patient denies any history of suicide attempts. He does report a history of psychiatric admissions, with most recent being 11/21 in [place]. He reports a history of hallucinations but denies any at this time, he does not appear internally preoccupied during interview. He reports he came to [place] from [place] as he had been here once before and felt like "people were trying to  better themselves and that's what I want to do". Patient reports the last time he was in [place] he stayed at the [place], he had not secured housing this time. He reports he has income through SSDI, is paid on the 3rd of every month. He reports he has supports including various family members in  as well as a cousin in [place]. He reports his cousin does not have secure housing, has been renting a room but may need to leave there. Patient is interested in shelter information but hopes he can find a room to rent after getting paid tomorrow. He asks appropriate information such as the process to obtain [facility] and how to apply for other assistance services. He reports he has been sleeping and eating adequately.     PSYCHIATRIC REVIEW OF SYSTEMS:     Thoughts of Self Harm: no  Thoughts of Harming Others: no  Hallucinations:no  Delusions:no  Obsessions/Compulsions:no  Anxiety:no  Change in Attention/Concentration:no  Loss of interest in normal activities:no  Hopeless/Helpless/Worthlessness:no  Change in Sleep:no  Change in Appetite:no      PHYSICAL COMPLAINTS/ROS/SIDE EFFECTS:  None reported      OBJECTIVE    BP 112/70    Pulse 81    Temp 98.1 ??F (36.7 ??C) (Oral)    Resp 18    SpO2 97%     Medications:  Current Facility-Administered Medications   Medication Dose Route Frequency  nicotine polacrilex (NICORETTE) gum 2 mg  2 mg Oral Q1H PRN    OLANZapine (ZYPREXA) tablet 7.5 mg  7.5 mg Oral Nightly    OLANZapine (ZYPREXA) tablet 5 mg  5 mg Oral Q6H PRN    diphenhydrAMINE (BENADRYL) injection 50 mg  50 mg IntraMUSCular Q4H PRN    Or    diphenhydrAMINE (BENADRYL) tablet 50 mg  50 mg Oral Q4H PRN    LORazepam (ATIVAN) injection 2 mg  2 mg IntraMUSCular Q2H PRN    Or    LORazepam (ATIVAN) tablet 2 mg  2 mg Oral Q2H PRN    OLANZapine (ZYPREXA) injection 10 mg  10 mg IntraMUSCular Q2H PRN    Or    OLANZapine zydis (ZYPREXA) disintegrating tablet 10 mg  10 mg Oral Q2H PRN     Current Outpatient Medications   Medication Sig     OLANZapine (ZYPREXA) 7.5 MG tablet Take 1 tablet by mouth nightly       LABS REVIEWED:  No results found for this or any previous visit (from the past 24 hour(s)).    OTHER RESULTS REVIEWED:  EHR including MD, NP and RN charting    PHYSICAL EXAM:  See ED MD evaluation     MENTAL STATUS EXAM:    General Appearance:shows no evidence of impairment  Speech:normal rate, tone and volume  Psychomotor:within normal limits  Affect:appropriate  Thought Process:goal directed  Thought Content:no hallucinations or delusions  Suicidal Thoughts:none  Homicidal/Violent Thoughts:none  Orientation:generally alert and oriented  Insight/Judgement:fair  Estimated Intelligence:average        ASSESSMENT/PLAN      1. The patient has improved over the last 2 days here in the BED. He has been provided scheduled olanzapine and has been eating and sleeping regularly. He presents today as oriented with adequate insight and judgement, no noted hallucinations or delusions. The patient no longer meets EIC criteria and is requesting discharge to self care. The patient is agreeable to continue olanzapine and [facility]. Patient will be provided with shelter information as well as crisis information.          Reason for need for continued hospital level of care: discharge to self care per patient request

## 2021-07-13 NOTE — ED Notes (Signed)
Bed search        [facility] No beds available .   [facility] No available beds.   [facility] No available beds.   [facility] No available beds.   [facility] No available beds.   [facility] No available beds.   [facility] Admissions closed at this time.   [facility] Not contacted at this time

## 2021-07-13 NOTE — ED Notes (Signed)
Pt evaluated by provider. Pt to be discharged home. Pt is calm and cooperative, denies SI/HI, is ambulating with a steady gait. Discharge instructions reviewed with patient, pt verbalizes understanding. Pt to return to the ER for any safety concerns.       Shirlean Schlein, RN  07/13/21 1010

## 2021-07-17 ENCOUNTER — Inpatient Hospital Stay
Admit: 2021-07-17 | Discharge: 2021-07-17 | Disposition: A | Discharge status: Home / Self Care | Payer: MEDICARE | Attending: Emergency Medicine

## 2021-07-17 DIAGNOSIS — F319 Bipolar disorder, unspecified: Principal | ICD-10-CM

## 2021-07-17 DIAGNOSIS — Z5900 Homelessness unspecified: Secondary | ICD-10-CM

## 2021-07-17 NOTE — Discharge Instructions (Signed)
Follow up your providers in [place].

## 2021-07-17 NOTE — ED Triage Notes (Signed)
Patient is alert, oriented and ambulatory.    During the standard-search process, patient did not want to participate. MD was called to metal-detector room.    Arrives to [facility] "because the police told me I should."    Patient is currently homeless in the [place] area. Tells this RN that he has signed a lease and is supposed to move into an apartment in [place] on 11/12; however, he has not been able to find transportation back to [place]. He tells me that his bank account is currently locked and he cannot find his phone.    Per the patient, he was at [facility] earlier in the evening, for the same reason, when he was redirected by PD.    Tells me that his mother is going to help him on Monday. I did give him a list of shelters.    MD discharged him from metal-detector room.    Very pleasant, calm and cooperative with hospital staff.

## 2021-07-17 NOTE — ED Provider Notes (Signed)
Chief Complaint   Patient presents with    Homeless       This is a 35 year old male who presents to the ED for assistance.      History is obtained per the patient as well as review of his records.      Patient lives in [place].  He does have history of bipolar disorder.  He has been hospitalized in [place] in November of 2021.      Patient states he recently has been homeless.  He has housing that is secured starting 07/23/2021 in [place].      Patient states he decided to come to [place] with his friend.  Patient then stated that he got into an argument with his friend and has been homeless.      Patient then stated that bank of [place] freeze doubt his online access.  He states that he also lost his wallet.  Patient states that he was a in contact with his family [place] and they should be able to help him by Monday.    Patient was seen in this facility on 07/11/2021 for erratic behavior.  He was restarted back on his olanzapine.  He was much better after several days.  Patient was subsequently discharged.      Patient apparently walked to [facility] ED because he is homeless.  He states that he felt disrespected there by the staff.  He states that the police were called because of patient malingering there.  Patient was asked to be removed from the premises.  Patient states he spoke with the police and they recommended he come to [facility] for assistance.      Patient states he is here.  Patient states he is not suicidal.  He is not homicidal.  Denies any loose in a shins.  He states he has been taking his medications.      Patient was being interviewed by the behavioral nurse in the security room.  Patient states that he did not wish to stay.  He states that he did not wish to give up his belongings.  He denies any ingestions or SI.        Medical History:  Current Problem List:   There is no problem list on file for this patient.      Past Medical History:  No past medical history on  file.    No past surgical history on file.    Social History     Socioeconomic History    Marital status: Single     Spouse name: Not on file    Number of children: Not on file    Years of education: Not on file    Highest education level: Not on file   Occupational History    Not on file   Tobacco Use    Smoking status: Not on file    Smokeless tobacco: Not on file   Substance and Sexual Activity    Alcohol use: Not on file    Drug use: Not on file    Sexual activity: Not on file   Other Topics Concern    Not on file   Social History Narrative    Not on file     Social Determinants of Health     Financial Resource Strain: Not on file   Food Insecurity: Not on file   Transportation Needs: Not on file   Physical Activity: Not on file   Stress: Not on file   Social  Connections: Not on file   Intimate Partner Violence: Not on file   Housing Stability: Not on file       Family History:  No family history on file.    Medications:  Patient medication list reviewed  Previous Medications    OLANZAPINE (ZYPREXA) 7.5 MG TABLET    Take 1 tablet by mouth nightly       Anticoagulants / Antiplatelet medications:  This patient does not have an active medication from one of the medication groupers.    Allergies:    Patient has no known allergies.    Review of Systems   Constitutional:  Negative for fever.   HENT:  Negative for sore throat.    Respiratory:  Negative for shortness of breath.    Gastrointestinal:  Negative for abdominal pain.   Genitourinary:  Negative for dysuria.   Neurological:  Negative for headaches.     ED Triage Vitals   BP Temp Temp src Pulse Resp SpO2 Height Weight   -- -- -- -- -- -- -- --     Physical Exam  Vitals and nursing note reviewed.   Constitutional:       Appearance: Normal appearance.   HENT:      Head: Normocephalic and atraumatic.   Musculoskeletal:         General: Normal range of motion.   Skin:     General: Skin is warm.      Capillary Refill: Capillary refill takes less than 2 seconds.    Neurological:      General: No focal deficit present.      Mental Status: He is alert.   Psychiatric:         Mood and Affect: Mood normal.         Behavior: Behavior normal.       Medical Decision Making:  This is a 35 year old male who is here with homeless situation.  Patient apparently was escorted from [facility] ED by police.  Patient was recommended to come here for assistance.      Patient has no complaints of SI, depression, hallucinations or ingestions.      Clinical exam shows an alert male in no distress.  He has GCS 15.  He has no smell of alcohol on breath.  His neck is supple.  He has no extremity swelling or deformity.  He has no focal deficits.  He is ambulating without any difficulty.      Patient was interviewed he in the security room at the metal detector.  He did not wish to have his belongings taken.  He did not wish to take issues off.  He did not wish to stay in the behavioral ED.      I spoke with the patient.  He has no signs of delirium or confusion.  He correctly stated that this was November 02-2021.  He also correctly stated that there was a our change with the daylight savings time.      Patient did not wish to stay in the ED.  At this point, to deescalate the situation, and because I see no evidence of toxicity, confusion, intoxication or confusion on the patient, and because the patient stated that he does not wish to stay, I cannot keep him against his wishes.  Patient is instructed to return if he is worse especially if he needs help with hallucinations or agitation or confusion.  Patient states he will do so.      ED Course:  Procedures    Vital Signs for this visit:  There were no vitals filed for this visit.    Lab findings during this visit (only abnormal values will be noted, if no value noted then the result was normal range):  Labs Reviewed - No data to display    Radiology studies during this visit and the past 24 hours:  No results found.    Medications given in the  ED:  Medications - No data to display    Diagnosis:  1. Homeless            Condition at disposition:  stable    DISPOSITION Decision To Discharge 07/17/2021 02:02:00 AM  Discharged    Discharge prescriptions and/or changes if applicable:       Medication List        ASK your doctor about these medications      OLANZapine 7.5 MG tablet  Commonly known as: ZYPREXA  Take 1 tablet by mouth nightly              Follow-up if applicable:  No follow-up provider specified.    Please note that portions of this document were created using the M*Modal Fluency Direct dictation system.  Any inconsistencies or typographical errors may be the result of mis-transcription that persist in spite of proof-reading and should be addressed with the document creator.       Scherrie Gerlach, MD  07/21/21 [phone]

## 2021-07-17 NOTE — ED Notes (Signed)
Pt evaluated by provider. Pt to be discharged home. Pt is calm and cooperative, denies SI/HI, is ambulating with a steady gait. Discharge instructions reviewed with patient, pt verbalizes understanding. Pt to return to the ER for any safety concerns.       Ruta Hinds, RN  07/17/21 [phone]

## 2021-10-31 ENCOUNTER — Inpatient Hospital Stay: Admit: 2021-10-31 | Discharge: 2021-10-31 | Payer: MEDICARE | Attending: Emergency Medicine

## 2021-10-31 ENCOUNTER — Emergency Department: Payer: MEDICARE

## 2021-10-31 ENCOUNTER — Encounter (HOSPITAL_BASED_OUTPATIENT_CLINIC_OR_DEPARTMENT_OTHER)

## 2021-10-31 ENCOUNTER — Other Ambulatory Visit (HOSPITAL_BASED_OUTPATIENT_CLINIC_OR_DEPARTMENT_OTHER)

## 2021-10-31 ENCOUNTER — Other Ambulatory Visit

## 2021-10-31 DIAGNOSIS — M25512 Pain in left shoulder: Secondary | ICD-10-CM

## 2021-10-31 NOTE — ED Triage Notes (Signed)
Pt presents with c/o left shoulder pain; originally injured on 09/16/21; pt states he was inpatient at [facility] from 1/12-2/1 and his shoulder wasn't bothering him then, it just started last night; no known recent trauma

## 2021-10-31 NOTE — ED Notes (Signed)
Pt. Eloped, still pending xray, ED provider made aware.      Sherian MaroonLailanie Breia Ocampo, RN  10/31/21 [phone]

## 2021-10-31 NOTE — ED Provider Notes (Signed)
Patient presented to ED with c/o left shoulder pain.   Per nursing notes, patient with intermittent left shoulder pain.  Vital signs reviewed and normal: T 36.9C, HR 107, BP 127/88, RR 18, O2 sat 97%.  Patient eloped from ED prior to my interview, evaluation, examination.      Kyasia Steuck E. Margy Sumler, MD  10/31/21 [phone]

## 2021-10-31 NOTE — ED Notes (Signed)
Pt. A/O X4, not in distress , c/o  L shoulder pain 6/10,  Intermittent pain occurence since January. Awaits to be seen by the ED provider.      Sherian MaroonLailanie Jaylan Duggar, RN  10/31/21 [phone]

## 2021-10-31 NOTE — ED Provider Notes (Signed)
HPI   Chief Complaint   Patient presents with   . Shoulder Pain       Pt left rpior to my evaluation , I never spoke w or examined this pt                    Glasgow Coma Scale Score: 15                                  Patient History   Past Medical History:   Diagnosis Date   . Schizoaffective disorder (CMS/HCC)      No past surgical history on file.  No family history on file.  Social History     Tobacco Use   . Smoking status: Every Day     Types: Cigarettes   . Smokeless tobacco: Not on file   Substance Use Topics   . Alcohol use: Yes   . Drug use: Yes     Types: Marijuana       Review of Systems   Review of Systems    Physical Exam   ED Triage Vitals [10/31/21 0136]   Temp Pulse Resp BP   36.9 C (98.4 F) 107 18 127/88      SpO2 Temp Source Heart Rate Source Patient Position   97 % Oral -- --      BP Location FiO2 (%)     -- --       Physical Exam  No orders to display       Labs Reviewed - No data to display    ED Course & MDM   Diagnoses as of 10/31/21 0310   Acute pain of left shoulder       MDM         Winona Legato, [place]  10/31/21 [phone]

## 2021-11-02 ENCOUNTER — Emergency Department: Admit: 2021-11-03 | Payer: MEDICARE | Primary: Sports Medicine

## 2021-11-02 ENCOUNTER — Inpatient Hospital Stay
Admit: 2021-11-02 | Discharge: 2021-11-02 | Payer: MEDICARE | Attending: Student in an Organized Health Care Education/Training Program

## 2021-11-02 DIAGNOSIS — S46912A Strain of unspecified muscle, fascia and tendon at shoulder and upper arm level, left arm, initial encounter: Secondary | ICD-10-CM

## 2021-11-02 DIAGNOSIS — M25512 Pain in left shoulder: Secondary | ICD-10-CM

## 2021-11-02 NOTE — ED Provider Notes (Signed)
Chief Complaint   Patient presents with    Shoulder Pain       HPI  Patient is a 36 year old male with no documented past medical history but report of left shoulder injury, presenting with request for food and MRI of left shoulder.  Shortly after arrival, approximately 5 minutes after being roomed, patient comes out of room to nurse's station to request food and states that he wants to leave.  I offer medical assistance and evaluation but asked patient to wait in his room.  He is unwilling to wait in room and states that he wants to leave.  I reiterate that I/we are happy to provide medical care of he requested, but if he is insisting on leaving he is allowed to leave.  Patient insists on leaving and he is escorted to the front of the building.      Medical History:  Current Problem List:   There is no problem list on file for this patient.      Past Medical History:  No past medical history on file.    No past surgical history on file.    Social History     Socioeconomic History    Marital status: Single     Spouse name: Not on file    Number of children: Not on file    Years of education: Not on file    Highest education level: Not on file   Occupational History    Not on file   Tobacco Use    Smoking status: Not on file    Smokeless tobacco: Not on file   Substance and Sexual Activity    Alcohol use: Not on file    Drug use: Not on file    Sexual activity: Not on file   Other Topics Concern    Not on file   Social History Narrative    Not on file     Social Determinants of Health     Financial Resource Strain: Not on file   Food Insecurity: Not on file   Transportation Needs: Not on file   Physical Activity: Not on file   Stress: Not on file   Social Connections: Not on file   Intimate Partner Violence: Not on file   Housing Stability: Not on file       Family History:  No family history on file.    Medications:  Patient medication list reviewed  Discharge Medication List as of 11/02/2021  7:51 AM        CONTINUE  these medications which have NOT CHANGED    Details   OLANZapine (ZYPREXA) 7.5 MG tablet Take 1 tablet by mouth nightly, Disp-15 tablet, R-0Normal             Anticoagulants / Antiplatelet medications:  This patient does not have an active medication from one of the medication groupers.    Allergies:    Patient has no known allergies.    Review of Systems   Constitutional: Negative.    Eyes: Negative.    Cardiovascular: Negative.    Gastrointestinal: Negative.    Endocrine: Negative.    Genitourinary: Negative.    Musculoskeletal: Negative.    Skin: Negative.    Allergic/Immunologic: Negative.    Neurological: Negative.    Hematological: Negative.    Psychiatric/Behavioral:  Positive for self-injury and suicidal ideas. The patient is nervous/anxious.    See history of present illness for relevant review of systems.    ED Triage Vitals [  11/02/21 0723]   BP Temp Temp Source Heart Rate Resp SpO2 Height Weight   134/77 97.6 ??F (36.4 ??C) Temporal 97 16 97 % 5\' 10"  (1.778 m) 176 lb (79.8 kg)     Physical Exam  Vitals and nursing note reviewed.   Constitutional:       General: He is not in acute distress.     Appearance: Normal appearance. He is normal weight. He is not ill-appearing.      Comments: Alert and oriented but seems to be under the influence of some drugs or alcohol   HENT:      Head: Normocephalic and atraumatic.      Right Ear: External ear normal.      Left Ear: External ear normal.      Nose: Nose normal.   Eyes:      Extraocular Movements: Extraocular movements intact.      Pupils: Pupils are equal, round, and reactive to light.   Pulmonary:      Effort: Pulmonary effort is normal.      Breath sounds: No stridor. No wheezing.   Abdominal:      General: Abdomen is flat.      Palpations: Abdomen is soft.   Musculoskeletal:         General: Normal range of motion.      Cervical back: Normal range of motion. No tenderness.      Comments: No gross MSK abnormalities   Skin:     General: Skin is warm and dry.    Neurological:      Mental Status: He is alert and oriented to person, place, and time.         Medical Decision Making  36 year old male with no documented past medical history but report of left shoulder injury, presenting with request for food and MRI of left shoulder.  As I a.m. walking towards patient's room to evaluate him, patient is found out at the nurses station desk.  He is requesting food and something drink.  He appears to be intoxicated on some substance but does not smell of alcohol.  He is asking for food before he has any kind of a workup.  I scored patient back to room, stating that I cannot feed him until I do a physical exam.  Patient becomes frustrated and states that he wants to leave.  I again offer to provide medical assistance and evaluation, but he seems on reasonably frustrated and insist upon leaving.  He answers some basic questions about his physical condition, and overall, I see no evidence of any traumatic injury or major condition.  Patient is allowed to leave.              ED Course:       Procedures    Vital Signs for this visit:  Vitals:    11/02/21 0723   BP: 134/77   Pulse: 97   Resp: 16   Temp: 97.6 ??F (36.4 ??C)   TempSrc: Temporal   SpO2: 97%   Weight: 176 lb (79.8 kg)   Height: 5\' 10"  (1.778 m)       Lab findings during this visit (only abnormal values will be noted, if no value noted then the result was normal range):  Labs Reviewed - No data to display    Recent radiology studies including this visit:  No results found.    Medications given in the ED:  Medications - No data to display    Diagnosis:  No diagnosis found.        Condition at disposition:  unchanged    DISPOSITION Lwbs After Rn Triage 11/02/2021 07:50:15 AM      Discharge prescriptions and/or changes if applicable:       Medication List        ASK your doctor about these medications      OLANZapine 7.5 MG tablet  Commonly known as: ZYPREXA  Take 1 tablet by mouth nightly              Follow-up if  applicable:  No follow-up provider specified.    Please note that portions of this document were created using the M*Modal Fluency Direct dictation system.  Any inconsistencies or typographical errors may be the result of mis-transcription that persist in spite of proof-reading and should be addressed with the document creator.       Charyl Bigger, MD  11/02/21 [phone]

## 2021-11-02 NOTE — ED Notes (Signed)
Pt ambulatory to nurses station shortly after being roomed,requesting a gingerale and to leave. ED provider escorted patient back to his room and then out of department.     Trish Fountain, RN  11/02/21 [phone]

## 2021-11-02 NOTE — ED Triage Notes (Signed)
L shoulder and right foot pain starting 2 days ago. He states he is unsure of injury. He states he has had a problem with the same shoulder about a month ago after "an altercation with the police in [place]". Ambulatory with steady gait. Observed moving arm without difficulty in triage.

## 2021-11-02 NOTE — ED Triage Notes (Signed)
Pt ambulatory to ED c/o worsening of chronic shoulder pain since injury 09/16/21. Pt reports pain is in rotator cuff, described as pinching and sharp but pt using arm. Pt tangential in triage, asking repeatedly for ginger ale and breakfast and to step out for a cigarette. Requesting MRI.

## 2021-11-02 NOTE — ED Notes (Signed)
Pt continues to ask this RN to step out for a cigarette. Pt looks at this RN, states "I'm so hungry I'm going to pass out", stands from chair and lowers self to ground. This RN instructed pt to get back into chair which he did. He then asked this RN why no one helped him when he fainted; this RN told pt he did not faint and was talking throughout triage. Pt states pancakes will help him feel less faint. This RN reiterated that pt needs to see provider and that breakfast will be ordered if appropriate. Pt ambulated without difficulty to room 11, sat on stretcher and laid back. Pt continued to ask for "pancakes, french toast, and waffles all at once" and then stated "I am going to fait", lowered head to L shoulder momentarily, then raised it again stating "see you let me faint". Railings are up and pt has call bell in hand.      Marisa Severin, RN  11/02/21 [phone]
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
## 2021-11-03 ENCOUNTER — Inpatient Hospital Stay
Admit: 2021-11-03 | Discharge: 2021-11-03 | Disposition: A | Discharge status: Home / Self Care | Payer: MEDICARE | Attending: Emergency Medicine

## 2021-11-03 DIAGNOSIS — M25512 Pain in left shoulder: Secondary | ICD-10-CM

## 2021-11-03 NOTE — ED Notes (Signed)
Patient is alert, oriented, calm and cooperative at time of discharge.  Pt is able to ambulate with a steady gait independently.  Medications, discharge instructions, and follow-up care discussed/reviewed with patient. Understanding is verbalized at this time.  Vital signs declined at time of discharge. Patient is encouraged to follow up with PCP regarding concerns discussed.  RN, patient, and MD in agreement with discharge plan. No safety concerns at this time.      Karie Mainland Giovanny Dugal, RN  11/03/21 [phone]

## 2021-11-03 NOTE — Discharge Instructions (Signed)
(  1)  sling to left shoulder for 4-5 days, if comfortable  (2)  Acetaminophen  (3)  follow up your PCP.  Final Xray reports are pending.  We will call if abnormal.

## 2021-11-03 NOTE — ED Provider Notes (Signed)
Up,HPI   Chief Complaint   Patient presents with   . Shoulder Pain           The patient is a somewhat unclear informant.  He reports atraumatic pain in the left shoulder about 2 weeks.  He also notes chronic foot pain.  He notes no recent falls or trauma.                    No data recorded                                Patient History   Past Medical History:   Diagnosis Date   . Schizoaffective disorder (CMS/HCC)      No past surgical history on file.  No family history on file.  Social History     Tobacco Use   . Smoking status: Every Day     Types: Cigarettes   . Smokeless tobacco: Not on file   Substance Use Topics   . Alcohol use: Yes   . Drug use: Yes     Types: Marijuana       Review of Systems   Review of Systems   Constitutional: Negative.    HENT: Negative.    Eyes: Negative.    Respiratory: Negative.    Gastrointestinal: Negative.    Endocrine: Negative.    Genitourinary: Negative.    Musculoskeletal:        The HPI, also the patient reports chronic pain in the left foot.   Skin: Negative.    Allergic/Immunologic: Negative.    Neurological: Negative.    Hematological: Negative.    Psychiatric/Behavioral: Negative.        Physical Exam   ED Triage Vitals [11/03/21 2319]   Temp Pulse Resp BP   36.6 C (97.8 F) 98 20 132/82      SpO2 Temp src Heart Rate Source Patient Position   95 % -- -- Sitting      BP Location FiO2 (%)     Left arm --       Physical Exam  Vitals and nursing note reviewed.       The patient is a well-developed, well-nourished, male.  He is slightly sleepy but conversant and cooperative.  Conjunctivae are clear.  The pupils are equal.  The voice is normal.  Respirations are even and unlabored.  Patient's mentation is slightly slow but he is conversant and cooperative.  He moves all 4 extremities.  The speech is fluent.  The cranial nerves are intact.  The gait is steady.  There is mild tenderness in the area of the left supraspinatus.  There is no swelling.  The patient is able to  abduct to 45 degrees.  Distal N/V is intact.    No orders to display       Labs Reviewed - No data to display    ED Course & MDM   Diagnoses as of 11/04/21 0003   Chronic left shoulder pain     The patient was stable in the emergency department.      MEDICAL DECISION MAKING  Nursing notes reviewed and agreed with.      EXTERNAL RECORDS REVIEWED  I reviewed the patient's entries into the epic and EDIE databases.  The patient has 40 ED visits over the past 12 months.    INDEPENDENT INTERPRETATIONS  N/A    TESTS/TREATMENT/HOSPITALIZATION CONSIDERED  Medical screening exam  CHRONIC CONDITIONS  Shoulder pain    ACUTE CONDITIONS  None      MEDICATIONS  APAP given in ED    DISCUSSION WITH OTHER PROVIDERS  None    SOCIAL DETERMINATES OF HEALTH  Homelessness    INFORMANTS  Patient    Patient was stable in the emergency department.  He has report of 2 weeks of shoulder pain was at variance with the [facility]  database which shows he has had shoulder pain for at least 5 months.    Diagnosis: Shoulder pain chronic  Disposition: Discharged       Genene Churn. Jeannett Senior, MD  11/04/21 0005

## 2021-11-04 ENCOUNTER — Inpatient Hospital Stay
Admit: 2021-11-04 | Discharge: 2021-11-04 | Disposition: A | Discharge status: Home / Self Care | Payer: MEDICARE | Attending: Emergency Medicine

## 2021-11-04 DIAGNOSIS — M25512 Pain in left shoulder: Secondary | ICD-10-CM

## 2021-11-04 MED ORDER — acetaminophen (Tylenol) tablet 650 mg
325 | Freq: Once | ORAL | Status: DC
Start: 2021-11-04 — End: 2021-11-04

## 2021-11-04 NOTE — Discharge Instructions (Signed)
You were seen in the emergency department complaining of 2 weeks of left shoulder pain.  Your medical records show this has been going on since at least last October.  We recommend: Follow-up with the center directed.  Use Tylenol or Motrin if needed.

## 2021-11-04 NOTE — ED Triage Notes (Signed)
Pt here with c/o chronic left shoulder pain. Seen here for same.

## 2021-11-08 ENCOUNTER — Encounter (HOSPITAL_BASED_OUTPATIENT_CLINIC_OR_DEPARTMENT_OTHER)

## 2021-11-08 ENCOUNTER — Other Ambulatory Visit

## 2021-11-08 ENCOUNTER — Inpatient Hospital Stay: Admit: 2021-11-08 | Discharge: 2021-11-08 | Discharge status: Against Medical Advice | Payer: MEDICARE

## 2021-11-08 NOTE — ED Notes (Signed)
Pt called in waiting room multiple times. Pt not found      Arnell Asal, RN  11/08/21 [phone]
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
## 2021-11-09 NOTE — ED Triage Notes (Signed)
Pt frequents this ED c/o shoulder pain that began in January - no new injuries

## 2021-11-10 ENCOUNTER — Inpatient Hospital Stay: Admit: 2021-11-10 | Discharge: 2021-11-10 | Discharge status: Against Medical Advice | Payer: MEDICARE

## 2021-11-10 NOTE — ED Notes (Signed)
Pt called multiple times in waiting room, pt not found.      Arnell AsalIvy , RN  11/10/21 [phone]

## 2021-11-12 ENCOUNTER — Other Ambulatory Visit

## 2021-11-12 ENCOUNTER — Encounter (HOSPITAL_BASED_OUTPATIENT_CLINIC_OR_DEPARTMENT_OTHER)

## 2021-11-12 ENCOUNTER — Inpatient Hospital Stay
Admit: 2021-11-12 | Discharge: 2021-11-12 | Disposition: A | Discharge status: Home / Self Care | Payer: MEDICARE | Attending: Emergency Medicine

## 2021-11-12 DIAGNOSIS — M25519 Pain in unspecified shoulder: Secondary | ICD-10-CM

## 2021-11-12 DIAGNOSIS — M25512 Pain in left shoulder: Secondary | ICD-10-CM

## 2021-11-12 MED ORDER — acetaminophen (Tylenol) tablet 650 mg
325 | Freq: Once | ORAL | Status: AC
Start: 2021-11-12 — End: 2021-11-12
  Administered 2021-11-12: 06:00:00 650 mg via ORAL

## 2021-11-12 MED FILL — ACETAMINOPHEN 325 MG TABLET: 325 325 mg | ORAL | Qty: 2

## 2021-11-12 NOTE — Discharge Instructions (Signed)
You were seen in the emergency department for your chronic shoulder pain.  We recommend you follow-up with primary care, avoid movements that cause your pain.  Use Tylenol or Advil if needed.

## 2021-11-12 NOTE — ED Triage Notes (Signed)
Pt here L shoulder pain.  Has known rotator cuff issue  Over used it at the gym.  No OTC medication

## 2021-11-12 NOTE — ED Provider Notes (Signed)
HPI   Chief Complaint   Patient presents with   . Shoulder Pain           The patient is a rather unclear informant.  He reports left shoulder pain for few days.  He reports no falls or trauma.  He reports the pain is increased with range of motion.  Patient reports no fever, chills, nausea, vomiting.                    No data recorded                                Patient History   Past Medical History:   Diagnosis Date   . Schizoaffective disorder (CMS/HCC)      History reviewed. No pertinent surgical history.  No family history on file.  Social History     Tobacco Use   . Smoking status: Every Day     Types: Cigarettes   . Smokeless tobacco: Never   Substance Use Topics   . Alcohol use: Yes   . Drug use: Yes     Types: Marijuana       Review of Systems   Review of Systems   Constitutional: Negative.    Respiratory: Negative.    Musculoskeletal: Positive for arthralgias.   Allergic/Immunologic: Negative.    Neurological: Negative.    Hematological: Negative.        Physical Exam   ED Triage Vitals [11/12/21 0034]   Temp Pulse Resp BP   36.9 C (98.4 F) 96 16 120/83      SpO2 Temp Source Heart Rate Source Patient Position   96 % Oral -- --      BP Location FiO2 (%)     -- --       Physical Exam  Vitals and nursing note reviewed.       The patient is a well-developed, well-nourished male.  He is alert and conversant.  Conjunctivae are clear.  The voice is normal.  Respirations are even and unlabored.  The patient's mentation is lucid, he follows commands.  Moves all 4 extremities.  Speech is fluent.  The cranial nerves are intact.  The gait is steady.  There is mild pain with ROM of the left glenohumeral joint.  The distal N/V is intact.    No orders to display       Labs Reviewed - No data to display    ED Course & MDM        MEDICAL DECISION MAKING  Nursing notes reviewed and agreed with.      EXTERNAL RECORDS REVIEWED  I reviewed the patient's entries into the EDIE database.    INDEPENDENT  INTERPRETATIONS  None    TESTS/TREATMENT/HOSPITALIZATION CONSIDERED  Pain medication    CHRONIC CONDITIONS  Homelessness, shoulder pain    ACUTE CONDITIONS  Homelessness, shoulder pain    MEDICATIONS  APAP given in ED    DISCUSSION WITH OTHER PROVIDERS  N/A    SOCIAL DETERMINATES OF HEALTH  Homelessness    INFORMANTS  Patient    The patient reports he cannot recall the last time he was seen by a practitioner for these pains.  In fact he was seen yesterday at [facility] with the same complaints.  He also reports his shoulder is only been hurting for a few days, EDIE reports show he has been complaining of this since at  least October 2022.  This was reviewed with the patient.    Diagnosis: Chronic shoulder pain, homelessness  Disposition: Discharged        Genene Churn. Jeannett Senior, MD  11/12/21 [phone]

## 2021-11-14 ENCOUNTER — Other Ambulatory Visit

## 2021-11-14 ENCOUNTER — Encounter (HOSPITAL_BASED_OUTPATIENT_CLINIC_OR_DEPARTMENT_OTHER)

## 2021-11-14 ENCOUNTER — Inpatient Hospital Stay: Admit: 2021-11-14 | Discharge: 2021-11-14 | Discharge status: Against Medical Advice | Payer: MEDICARE

## 2021-11-14 ENCOUNTER — Inpatient Hospital Stay: Admit: 2021-11-14 | Discharge: 2021-11-14 | Payer: MEDICARE

## 2021-11-14 NOTE — ED Triage Notes (Signed)
Pt lwbs 10 minutes after checking in. Ambulated out of waiting room with steady gait.
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
## 2021-11-17 ENCOUNTER — Inpatient Hospital Stay
Admit: 2021-11-17 | Discharge: 2021-11-17 | Disposition: A | Discharge status: Home / Self Care | Payer: MEDICARE | Attending: Emergency Medicine

## 2021-11-17 ENCOUNTER — Encounter (HOSPITAL_BASED_OUTPATIENT_CLINIC_OR_DEPARTMENT_OTHER)

## 2021-11-17 ENCOUNTER — Other Ambulatory Visit

## 2021-11-17 DIAGNOSIS — Z139 Encounter for screening, unspecified: Secondary | ICD-10-CM

## 2021-11-17 DIAGNOSIS — M791 Myalgia, unspecified site: Secondary | ICD-10-CM

## 2021-11-17 NOTE — Discharge Instructions (Signed)
Take medication as prescribed

## 2021-11-17 NOTE — ED Notes (Signed)
Pt walked out w/o notifying staff     Mat Carne, RN  11/17/21 [phone]
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
## 2021-12-03 ENCOUNTER — Inpatient Hospital Stay
Admit: 2021-12-03 | Discharge: 2021-12-03 | Disposition: A | Discharge status: Home / Self Care | Payer: MEDICARE | Attending: Emergency Medicine

## 2021-12-03 DIAGNOSIS — M722 Plantar fascial fibromatosis: Secondary | ICD-10-CM

## 2021-12-03 MED ORDER — IBUPROFEN 400 MG PO TABS
400 MG | ORAL | Status: AC
Start: 2021-12-03 — End: 2021-12-03
  Administered 2021-12-03: 05:00:00 400 mg via ORAL

## 2021-12-03 MED FILL — IBUPROFEN 400 MG PO TABS: 400 MG | ORAL | Qty: 1

## 2021-12-03 NOTE — ED Provider Notes (Signed)
Chief Complaint   Patient presents with    Foot Pain       HPI  Eddie Morrison is a 36 y.o. yo male who presents with complaints of right heel pain.  He states it started yesterday.  It hurts when he walks on it.  Hurts when he pushes on it.  No swelling or discoloration.  It is in the bottom of his right heel.  No injury and no fall.  He is able to move his foot and ankle without difficulty.  Not taken or done anything for the pain.  No known injury.  Able to walk on it.  Medical History:  Current Problem List:   There is no problem list on file for this patient.      Past Medical History:  No past medical history on file.    No past surgical history on file.    Social History     Socioeconomic History    Marital status: Single     Spouse name: Not on file    Number of children: Not on file    Years of education: Not on file    Highest education level: Not on file   Occupational History    Not on file   Tobacco Use    Smoking status: Not on file    Smokeless tobacco: Not on file   Substance and Sexual Activity    Alcohol use: Not on file    Drug use: Not on file    Sexual activity: Not on file   Other Topics Concern    Not on file   Social History Narrative    Not on file     Social Determinants of Health     Financial Resource Strain: Not on file   Food Insecurity: Not on file   Transportation Needs: Not on file   Physical Activity: Not on file   Stress: Not on file   Social Connections: Not on file   Intimate Partner Violence: Not on file   Housing Stability: Not on file       Family History:  No family history on file.    Medications:  Patient medication list reviewed  This patient does not have an active medication from one of the medication groupers.    No current facility-administered medications on file prior to encounter.     Current Outpatient Medications on File Prior to Encounter   Medication Sig Dispense Refill    OLANZapine (ZYPREXA) 7.5 MG tablet Take 1 tablet by mouth nightly 15 tablet 0        Allergies:    Patient has no known allergies.    Review of Systems see HPI      Vitals:    12/03/21 0100   BP: 135/82   Pulse: (!) 106   Resp: 18   Temp: 98 ??F (36.7 ??C)   TempSrc: Temporal   SpO2: 96%   Weight: 172 lb (78 kg)   Height: 5\' 10"  (1.778 m)     Physical Exam  General:  awake, alert, appropriate with exam  HEENT: PERRLA, EOMI, normal conjuctiva  Neck: supple  OP: mucosa moist  Lungs:  No respiratory distress  Ext: warm, dry, well perfused without edema; tenderness with palpation posterior plantar heel without crepitus  Skin:  No erythema or ecchymosis to the heel  Neuro: CN 2-12 intact, normal motor with 5+ strength with flexion-extension at the ankle and the toes of the right foot and sensation is  intact to light touch, normal gait, normal speech  Pulses: 2+ right dorsalis pedis pulse  Capillary refill less than 3 seconds in the toes            Additional information was gathered from the following independent historian(s):  - the following record sources:  -- this EMR (internal records)  -- this EMR (Care Everywhere)      Medical Decision Making:  Eddie Morrison is a 36 y.o. yo male who presents with heel pain.  I suspect he has plantar fasciitis.  No evidence of infection.  No injury and able to walk on it and I do not think he has a calcaneus fracture.  No evidence of cellulitis.  I recommended anti-inflammatories, icing and stretching.  I recommended follow-up with PCP.    ED Course:       Procedures    Vital Signs for this visit:  Patient Vitals for the past 12 hrs:   Temp Pulse Resp BP SpO2   12/03/21 0100 98 ??F (36.7 ??C) (!) 106 18 135/82 96 %       Lab findings during this visit (only abnormal values will be noted, if no value noted then the result was normal range):  Labs Reviewed - No data to display    Radiology studies during this visit and the past 24 hours:  No results found.    Medications given in the ED:  Medications   ibuprofen (ADVIL;MOTRIN) tablet 400 mg (has no administration in  time range)       Diagnosis:  Final diagnoses:   Plantar fasciitis of right foot       Disposition:  DISPOSITION Decision To Discharge 12/03/2021 01:02:48 AM      Discharge prescriptions and/or changes if applicable:       Medication List        ASK your doctor about these medications      OLANZapine 7.5 MG tablet  Commonly known as: ZYPREXA  Take 1 tablet by mouth nightly              Follow-up if applicable:  No follow-up provider specified.    Please note that portions of this document were created using the M*Modal Fluency Direct dictation system.  Any inconsistencies or typographical errors may be the result of mis-transcription that persist in spite of proof-reading and should be addressed with the document creator.        Eddie Shiver, MD  12/03/21 [phone]

## 2021-12-03 NOTE — ED Triage Notes (Signed)
Pt ambulated into the ED with c/o right heal pain that started yesterday. Pt is able to ambulate with steady gait without issue.  No swelling, deformity noted.  CSM intake.  Pt denies any injury.  Did not take any medications PTA.  Pt reports he just moved here from [place] yesterday.  Arrives with another hospital's bracelet on his arm.

## 2021-12-03 NOTE — Discharge Instructions (Addendum)
Ibuprofen:  400 mg every 8 hours for the next 5 days then as needed for pain.  Ice the area 3 times a day  Gentle stretching every 2 hours.  Follow-up with your primary care provider.  If he do not have a primary care provider, follow-up with Glennis Brink PCP referral line to establish primary care.

## 2021-12-03 NOTE — ED Notes (Signed)
Patient provided with verbal and paper discharge instructions. Patient verbalized understanding of discharge information with no additional questions. Medication changes reviewed as follows: OTC tylenol and/or ibuprofen per package instruction. Call PCP referral line to establish primary care. Patient alert, calm, and cooperative at time of discharge. VSS. Patient instructed to follow up/return if needed.  Patient left department via ambulation with steady gait.       Joyce Copa [place], RN  12/03/21 [phone]

## 2021-12-05 ENCOUNTER — Observation Stay
Admit: 2021-12-05 | Discharge: 2021-12-05 | Disposition: A | Discharge status: Home / Self Care | Payer: Medicare Other | Admitting: Emergency Medicine

## 2021-12-05 DIAGNOSIS — F319 Bipolar disorder, unspecified: Secondary | ICD-10-CM

## 2021-12-05 LAB — CBC WITH AUTO DIFFERENTIAL
Basophils %: 1 % (ref 0–2)
Basophils Absolute: 0 10*3/uL (ref 0.0–0.1)
Eosinophils %: 3 % (ref 0–5)
Eosinophils Absolute: 0.2 10*3/uL (ref 0.0–0.5)
Hematocrit: 40.1 % — ABNORMAL LOW (ref 42.0–52.0)
Hemoglobin: 13.6 g/dL — ABNORMAL LOW (ref 14.0–18.0)
Immature Granulocytes %: 0 % (ref 0.0–0.6)
Immature Granulocytes Absolute: 0 10*3/uL (ref 0.00–0.04)
Lymphocytes Absolute: 1.7 10*3/uL (ref 1.3–3.6)
Lymphocytes: 31 % (ref 14–46)
MCH: 31 PG (ref 27.0–31.0)
MCHC: 33.9 g/dL (ref 33.0–37.0)
MCV: 91.3 FL (ref 80.0–94.0)
MPV: 9.5 FL (ref 7.4–10.4)
Monocytes %: 11 % (ref 5–12)
Monocytes Absolute: 0.6 10*3/uL (ref 0.3–0.8)
Neutrophils Absolute: 2.9 10*3/uL (ref 1.6–6.1)
Nucleated RBCs: 0 PER 100 WBC
Platelets: 221 10*3/uL (ref 130–400)
RBC: 4.39 M/uL — ABNORMAL LOW (ref 4.70–6.10)
RDW: 13.6 % (ref 11.5–14.5)
Seg Neutrophils: 54 % (ref 47–80)
WBC: 5.4 10*3/uL (ref 4.5–10.9)
nRBC: 0 10*3/uL

## 2021-12-05 LAB — COMPREHENSIVE METABOLIC PANEL
ALT: 29 U/L (ref 12–78)
AST: 16 U/L (ref 10–37)
Albumin: 3.2 g/dL — ABNORMAL LOW (ref 3.4–5.0)
Alk Phosphatase: 61 U/L (ref 43–117)
BUN: 17 MG/DL (ref 7–22)
CO2: 28 mmol/L (ref 21–32)
Calcium: 8.6 MG/DL (ref 8.5–10.1)
Chloride: 110 mmol/L — ABNORMAL HIGH (ref 98–108)
Creatinine: 1.05 MG/DL (ref 0.70–1.30)
Est, Glom Filt Rate: >60 mL/min/{1.73_m2} (ref >60)
Glucose: 104 mg/dL (ref 74–106)
Potassium: 3.8 mmol/L (ref 3.4–5.1)
Sodium: 140 mmol/L (ref 136–145)
Total Bilirubin: 0.3 mg/dL (ref 0.00–1.00)
Total Protein: 6.2 g/dL — ABNORMAL LOW (ref 6.4–8.2)

## 2021-12-05 LAB — COVID-19: SARS-CoV-2, PCR: POSITIVE — CR

## 2021-12-05 LAB — ETHANOL: Ethanol Lvl: <3 mg/dL (ref <3.0)

## 2021-12-05 MED ORDER — LORAZEPAM 1 MG PO TABS
1 MG | ORAL | Status: DC | PRN
Start: 2021-12-05 — End: 2021-12-05

## 2021-12-05 MED ORDER — LORAZEPAM 2 MG/ML IJ SOLN
2 MG/ML | INTRAMUSCULAR | Status: DC | PRN
Start: 2021-12-05 — End: 2021-12-05

## 2021-12-05 MED ORDER — ACETAMINOPHEN 325 MG PO TABS
325 MG | Freq: Four times a day (QID) | ORAL | Status: DC | PRN
Start: 2021-12-05 — End: 2021-12-05

## 2021-12-05 MED ORDER — ACETAMINOPHEN 650 MG RE SUPP
650 MG | Freq: Four times a day (QID) | RECTAL | Status: DC | PRN
Start: 2021-12-05 — End: 2021-12-05

## 2021-12-05 MED ORDER — OLANZAPINE 2.5 MG PO TABS
2.5 MG | Freq: Every evening | ORAL | Status: DC
Start: 2021-12-05 — End: 2021-12-05

## 2021-12-05 NOTE — ED Notes (Signed)
Clearing CVO Following Provider Consultation    Patient placed on CVO upon arrival to unit. Case reviewed with Provider, Dr. Malen Gauze, who feels pt is safe for a lower level of observation. Pt transitioned to q15 min reassurance checks at time of provider's order.    This assessment takes into consideration the patient being within a controlled environment with the following standard mitigation strategies: Management of sharps, regular environmental assessment for safety of the unit, patient search upon admission with removal of unsafe objects, and mitigation of ligature risk including both environmental limitations and limitations of unsafe objects including shoelaces, belts, clothing with cords or drawstrings.        Francie Massing, RN  12/05/21 [phone]

## 2021-12-05 NOTE — ED Notes (Signed)
Patient was processed through the metal detector and received a standard search upon admission to [facility] with removal of unsafe objects and ligature risks including but not limited to shoes, belts, clothing with cords or drawstrings, and jewelry. Patient search was completed per unit process by [facility] and observed by security.        Francie Massing, RN  12/05/21 [phone]

## 2021-12-05 NOTE — ED Triage Notes (Addendum)
Pt reports "I think I'm having a manic episode. I've been off my meds. I'm making bad decisions sexually. I want to restabilize on my meds". Reports he came back from [place] about 5 days ago and has not had his meds in about 5 days. Reports he gets his meds in [place]. He is in [place] for work but will go back next week when he gets paid to deposit his money and return to [place] because he wants to move to [place].  Denies depression. Endorses anxiety.  Denies AVH  Denies SI/HI  Uses marijuana. Denies any other drug use. Drinks alcohol about 3-4 times a week but not daily. Says quantity is "not a lot"

## 2021-12-05 NOTE — ED Notes (Signed)
Patient is alert, oriented, calm and cooperative.   Patient ambulates with a steady gait independently.  Discharge instructions, and follow up care reviewed with patient who verbalized understanding.  Vital signs are stable.  RN, MD, and Patient in agreement with discharge plan, no safety concerns noted.       Reino Kent, RN  12/05/21 [phone]

## 2021-12-05 NOTE — ED Notes (Signed)
Provider notified of COVID status     Francie Massing, [place]  12/05/21 [phone]

## 2021-12-05 NOTE — ED Provider Notes (Signed)
Chief Complaint   Patient presents with    Mental Health Problem       This is a 36 year old male with a history of bipolar disorder who presents complaining of feeling manic.  He states that he has been off his medications for the past 5 days.  He says he just recently moved here from [place] and does not have any doctors here.  He is feeling anxious and having racing thoughts.  He denies any depression or suicidal ideation.  He denies any auditory or visual hallucinations.  He admits to drinking alcohol 3-4 times per week.  He denies a history of alcohol withdrawal or seizures.  He denies other drug use.  He denies any recent illnesses or injuries.  He has no physical complaints.        Medical History:  Current Problem List:   There is no problem list on file for this patient.      Past Medical History:  History reviewed. No pertinent past medical history.    History reviewed. No pertinent surgical history.    Social History     Socioeconomic History    Marital status: Single     Spouse name: Not on file    Number of children: Not on file    Years of education: Not on file    Highest education level: Not on file   Occupational History    Not on file   Tobacco Use    Smoking status: Every Day     Packs/day: 0.50     Types: Cigarettes    Smokeless tobacco: Never   Vaping Use    Vaping Use: Never used   Substance and Sexual Activity    Alcohol use: Yes    Drug use: Yes     Types: Marijuana Sheran Fava(Weed)    Sexual activity: Not on file   Other Topics Concern    Not on file   Social History Narrative    Not on file     Social Determinants of Health     Financial Resource Strain: Not on file   Food Insecurity: Not on file   Transportation Needs: Not on file   Physical Activity: Not on file   Stress: Not on file   Social Connections: Not on file   Intimate Partner Violence: Not on file   Housing Stability: Not on file       Family History:  History reviewed. No pertinent family history.    Medications:  Patient  medication list reviewed  Previous Medications    CHLORPROMAZINE (THORAZINE) 100 MG TABLET        HYDROXYZINE PAMOATE (VISTARIL) 50 MG CAPSULE        LORAZEPAM (ATIVAN) 0.5 MG TABLET    Take 0.5 mg by mouth 2 times daily as needed.    MELATONIN 3 MG TABS TABLET    Take 3 mg by mouth nightly as needed    OLANZAPINE (ZYPREXA) 7.5 MG TABLET    Take 1 tablet by mouth nightly       Anticoagulants / Antiplatelet medications:  This patient does not have an active medication from one of the medication groupers.    Allergies:    Patient has no known allergies.    Review of Systems   Constitutional:  Negative for chills and fever.   HENT:  Negative for congestion and sore throat.    Eyes:  Negative for discharge and redness.   Respiratory:  Negative for cough and shortness of  breath.    Cardiovascular:  Negative for chest pain and palpitations.   Gastrointestinal:  Negative for abdominal pain, diarrhea, nausea and vomiting.   Genitourinary:  Positive for testicular pain. Negative for difficulty urinating, dysuria, frequency, hematuria, penile discharge, penile pain, penile swelling and scrotal swelling.   Musculoskeletal:  Negative for back pain, neck pain and neck stiffness.   Skin:  Negative for rash.   Neurological:  Negative for dizziness, weakness, numbness and headaches.   Psychiatric/Behavioral:  Positive for dysphoric mood. Negative for hallucinations and suicidal ideas. The patient is nervous/anxious.    See history of present illness for relevant review of systems.    ED Triage Vitals [12/05/21 0330]   BP Temp Temp src Heart Rate Resp SpO2 Height Weight   122/73 98.3 ??F (36.8 ??C) -- 96 18 95 % -- --     Physical Exam  Constitutional:       General: He is not in acute distress.     Appearance: Normal appearance. He is not ill-appearing or toxic-appearing.   HENT:      Head: Normocephalic and atraumatic.   Eyes:      Conjunctiva/sclera: Conjunctivae normal.      Pupils: Pupils are equal, round, and reactive to light.    Cardiovascular:      Rate and Rhythm: Normal rate and regular rhythm.      Heart sounds: Normal heart sounds. No murmur heard.    No friction rub. No gallop.   Pulmonary:      Effort: Pulmonary effort is normal.      Breath sounds: Normal breath sounds.   Abdominal:      General: Bowel sounds are normal.      Palpations: Abdomen is soft.      Tenderness: There is no abdominal tenderness. There is no guarding or rebound.      Hernia: No hernia is present.   Musculoskeletal:         General: No swelling. Normal range of motion.   Skin:     General: Skin is warm and dry.   Neurological:      General: No focal deficit present.      Mental Status: He is alert and oriented to person, place, and time.   Psychiatric:         Mood and Affect: Mood normal.         Behavior: Behavior normal.       Medical Decision Making  Patient presents stating that he has been off his medications for the past 5 days and has been feeling manic and anxious with racing thoughts.  He has had no depression or suicidal ideation or hallucinations.  He admits to drinking alcohol 3-4 times per week but denies a history of withdrawal symptoms.  I will restart him on his olanzapine.  I will check routine test for medical clearance.  Given his history of alcohol abuse I will place him on a CIWA protocol.  Interestingly when I view is medical records I see that he was seen here 2 days ago for an unrelated complaint of heel pain and was not complaining of mania then.  I will ask that he undergo psychiatric evaluation.  I will place him in ED observation.    Amount and/or Complexity of Data Reviewed  Labs: ordered.    Risk  OTC drugs.  Prescription drug management.          Additional information was gathered from the following independent historian(s):  -  the following record sources:  -- this EMR (internal records)  -- this EMR (Care Everywhere)    ED Course:  ED Course as of 12/05/21 0835   Mon Dec 05, 2021   0444 The patient's COVID test is  positive.  We will move him into the private room.  He is not demonstrating any respiratory symptoms or other COVID symptoms. [SY]   0517 Based on risk stratification criteria, this patient will be admitted to emergency department observation for behavioral health emergency. Observation services are necessary to determine the patient's disposition.  During this observation period we will perform serial diagnostic studies that require the passage of time to allow decision making.  Specifically, the plan for this patient is as follows.  Medical clearance, psychiatric evaluation.  [SY]   [id number] Patient is requesting discharge.  No safety concerns.  The patient was monitored throughout the observation period.  At the end of observation, the patient's condition Improved/unchanged/worse: unchanged  .  The patient's disposition at the end of observation is: discharge home.   [KM]      ED Course User Index  [KM] Graylin Shiver, MD  [SY] Duanne Guess, MD       Procedures    Vital Signs for this visit:  Vitals:    12/05/21 0330   BP: 122/73   Pulse: 96   Resp: 18   Temp: 98.3 ??F (36.8 ??C)   SpO2: 95%       Lab findings during this visit (only abnormal values will be noted, if no value noted then the result was normal range):  Labs Reviewed   COVID-19       Recent radiology studies including this visit:  No results found.    Medications given in the ED:  Medications   acetaminophen (TYLENOL) tablet 650 mg (has no administration in time range)     Or   acetaminophen (TYLENOL) suppository 650 mg (has no administration in time range)       Diagnosis:  No diagnosis found.        Condition at disposition:  ongoing    DISPOSITION Ed Observation 12/05/2021 04:04:00 AM  discharge    Discharge prescriptions and/or changes if applicable:       Medication List        ASK your doctor about these medications      chlorproMAZINE 100 MG tablet  Commonly known as: THORAZINE     hydrOXYzine pamoate 50 MG capsule  Commonly known as:  VISTARIL     LORazepam 0.5 MG tablet  Commonly known as: ATIVAN     melatonin 3 MG Tabs tablet     OLANZapine 7.5 MG tablet  Commonly known as: ZYPREXA  Take 1 tablet by mouth nightly              Follow-up if applicable:  No follow-up provider specified.    Please note that portions of this document were created using the M*Modal Fluency Direct dictation system.  Any inconsistencies or typographical errors may be the result of mis-transcription that persist in spite of proof-reading and should be addressed with the document creator.        Graylin Shiver, MD  12/05/21 [phone]

## 2021-12-05 NOTE — Discharge Instructions (Addendum)
Maintain isolation and mask wearing for 10 days  Follow-up with Glennis Brink PCP referral line to establish primary care

## 2021-12-13 DIAGNOSIS — R519 Headache, unspecified: Secondary | ICD-10-CM

## 2021-12-13 DIAGNOSIS — Z760 Encounter for issue of repeat prescription: Secondary | ICD-10-CM

## 2021-12-13 NOTE — ED Provider Notes (Signed)
No chief complaint on file.      36 year old male history bipolar disorder presents to the ED requesting refills of his psych medications as he was unable fill the prescriptions with his typical provider in [place].  Patient denies any complaints in the ED        Medical History:  Current Problem List:   There is no problem list on file for this patient.      Past Medical History:  History reviewed. No pertinent past medical history.    History reviewed. No pertinent surgical history.    Social History     Socioeconomic History    Marital status: Single     Spouse name: Not on file    Number of children: Not on file    Years of education: Not on file    Highest education level: Not on file   Occupational History    Not on file   Tobacco Use    Smoking status: Every Day     Packs/day: 0.50     Types: Cigarettes    Smokeless tobacco: Never   Vaping Use    Vaping Use: Never used   Substance and Sexual Activity    Alcohol use: Yes    Drug use: Yes     Types: Marijuana Sheran Fava)    Sexual activity: Not on file   Other Topics Concern    Not on file   Social History Narrative    Not on file     Social Determinants of Health     Financial Resource Strain: Not on file   Food Insecurity: Not on file   Transportation Needs: Not on file   Physical Activity: Not on file   Stress: Not on file   Social Connections: Not on file   Intimate Partner Violence: Not on file   Housing Stability: Not on file       Family History:  History reviewed. No pertinent family history.    Medications:  Patient medication list reviewed  This patient does not have an active medication from one of the medication groupers.    Allergies:    Patient has no known allergies.    Review of Systems   Constitutional:  Negative for activity change, appetite change, chills, diaphoresis, fatigue, fever and unexpected weight change.   HENT:  Negative for congestion, dental problem, drooling, ear discharge, ear pain, facial swelling, hearing loss, mouth sores,  nosebleeds, postnasal drip, rhinorrhea, sinus pressure, sinus pain, sneezing, sore throat, tinnitus, trouble swallowing and voice change.    Eyes:  Negative for photophobia, pain, discharge, redness, itching and visual disturbance.   Respiratory:  Negative for apnea, cough, choking, chest tightness, shortness of breath, wheezing and stridor.    Cardiovascular:  Negative for chest pain, palpitations and leg swelling.   Gastrointestinal:  Negative for abdominal distention, abdominal pain, anal bleeding, blood in stool, constipation, diarrhea, nausea, rectal pain and vomiting.   Genitourinary:  Negative for decreased urine volume, difficulty urinating, dysuria, enuresis, flank pain, frequency, genital sores, hematuria, penile discharge, penile pain, penile swelling, scrotal swelling, testicular pain and urgency.   Musculoskeletal:  Negative for arthralgias, back pain, gait problem, joint swelling, myalgias, neck pain and neck stiffness.   Skin:  Negative for color change, pallor, rash and wound.   Neurological:  Negative for dizziness, tremors, seizures, syncope, facial asymmetry, speech difficulty, weakness, light-headedness, numbness and headaches.   Psychiatric/Behavioral:  Negative for agitation, behavioral problems, confusion, decreased concentration, dysphoric mood, hallucinations, self-injury, sleep disturbance  and suicidal ideas. The patient is not nervous/anxious and is not hyperactive.    All other systems reviewed and are negative.      Vitals:    12/13/21 2332   BP: (!) 132/90   Pulse: 87   Resp: 17   Temp: 98.1 F (36.7 C)   TempSrc: Tympanic   SpO2: 97%   Weight: 170 lb (77.1 kg)     Physical Exam  Vitals and nursing note reviewed.   Constitutional:       General: He is not in acute distress.     Appearance: Normal appearance. He is normal weight. He is not ill-appearing, toxic-appearing or diaphoretic.   HENT:      Head: Normocephalic and atraumatic.      Right Ear: Tympanic membrane, ear canal and  external ear normal. There is no impacted cerumen.      Left Ear: Tympanic membrane, ear canal and external ear normal. There is no impacted cerumen.      Nose: Nose normal. No congestion or rhinorrhea.      Mouth/Throat:      Mouth: Mucous membranes are moist.      Pharynx: Oropharynx is clear. No oropharyngeal exudate or posterior oropharyngeal erythema.   Eyes:      General: No scleral icterus.        Right eye: No discharge.         Left eye: No discharge.      Extraocular Movements: Extraocular movements intact.      Conjunctiva/sclera: Conjunctivae normal.      Pupils: Pupils are equal, round, and reactive to light.   Neck:      Vascular: No carotid bruit.   Cardiovascular:      Rate and Rhythm: Normal rate.      Pulses: Normal pulses.      Heart sounds: Normal heart sounds. No murmur heard.    No friction rub. No gallop.   Pulmonary:      Effort: Pulmonary effort is normal. No respiratory distress.      Breath sounds: Normal breath sounds. No stridor. No wheezing, rhonchi or rales.   Chest:      Chest wall: No tenderness.   Abdominal:      General: Abdomen is flat. Bowel sounds are normal. There is no distension.      Palpations: Abdomen is soft. There is no mass.      Tenderness: There is no abdominal tenderness. There is no right CVA tenderness, left CVA tenderness, guarding or rebound.      Hernia: No hernia is present.   Musculoskeletal:         General: No swelling, tenderness, deformity or signs of injury. Normal range of motion.      Cervical back: Normal range of motion and neck supple. No rigidity or tenderness.      Right lower leg: No edema.      Left lower leg: No edema.   Lymphadenopathy:      Cervical: No cervical adenopathy.   Skin:     General: Skin is warm and dry.      Coloration: Skin is not jaundiced or pale.      Findings: No bruising, erythema, lesion or rash.   Neurological:      General: No focal deficit present.      Mental Status: He is alert and oriented to person, place, and time.  Mental status is at baseline.      Cranial Nerves: No cranial nerve  deficit.      Sensory: No sensory deficit.      Motor: No weakness.      Coordination: Coordination normal.      Gait: Gait normal.      Deep Tendon Reflexes: Reflexes normal.   Psychiatric:         Mood and Affect: Mood normal.         Behavior: Behavior normal.         Thought Content: Thought content normal.         Judgment: Judgment normal.       Medical Decision Making:      ED Course:  ED Course as of 12/14/21 0334   Wed Dec 14, 2021   0333 Pt spoke to telepsycha nd given script for olanzapine [WS]      ED Course User Index  [WS] Jola Babinski, MD       Procedures    Vital Signs for this visit:  Patient Vitals for the past 12 hrs:   Temp Pulse Resp BP SpO2   12/13/21 2332 98.1 F (36.7 C) 87 17 (!) 132/90 97 %       Lab findings during this visit (only abnormal values will be noted, if no value noted then the result was normal range):  Labs Reviewed - No data to display    Radiology studies during this visit and the past 24 hours:  No results found.     Medications given in the ED:  Medications - No data to display    Diagnosis:  Final diagnoses:   Issue of repeat prescription for medication           Condition at disposition:  resolved    Disposition:  DISPOSITION Decision To Discharge 12/14/2021 03:33:39 AM      Discharge prescriptions and/or changes if applicable:       Medication List        CONTINUE taking these medications      OLANZapine 7.5 MG tablet  Commonly known as: ZYPREXA  Take 1 tablet by mouth nightly            ASK your doctor about these medications      chlorproMAZINE 100 MG tablet  Commonly known as: THORAZINE     hydrOXYzine pamoate 50 MG capsule  Commonly known as: VISTARIL     LORazepam 0.5 MG tablet  Commonly known as: ATIVAN     melatonin 3 MG Tabs tablet              Follow-up if applicable:  No follow-up provider specified.    Please note that portions of this document were created using the M*Modal Fluency  Direct dictation system.  Any inconsistencies or typographical errors may be the result of mis-transcription that persist in spite of proof-reading and should be addressed with the document creator.       Jola Babinski, MD  12/14/21 [phone]

## 2021-12-13 NOTE — ED Triage Notes (Signed)
Pt from home with c/o h/a and head pressure around left eye. Pt recently was diagnosed with covid 8 days ago but states he has been feeling better. Vss.

## 2021-12-13 NOTE — ED Provider Notes (Signed)
Chief Complaint   Patient presents with    Headache       36 year old male presents to the ED complaining of a generalized headache.  Patient states that the headache had a gradual onset approximately 3 hours ago his rated a 7 out 10 dull constant aching.  This not to have worsened again patient's life.  Patient is diagnosed with COVID a days ago and wheezes headache is due to his COVID symptoms.  He denies any other associated symptoms.  Patient has not taken any pain medication for his headache and decided just to come to the hopsital        Medical History:  Current Problem List:   There is no problem list on file for this patient.      Past Medical History:  No past medical history on file.    No past surgical history on file.    Social History     Socioeconomic History    Marital status: Single     Spouse name: Not on file    Number of children: Not on file    Years of education: Not on file    Highest education level: Not on file   Occupational History    Not on file   Tobacco Use    Smoking status: Every Day     Packs/day: 0.50     Types: Cigarettes    Smokeless tobacco: Never   Vaping Use    Vaping Use: Never used   Substance and Sexual Activity    Alcohol use: Yes    Drug use: Yes     Types: Marijuana Sheran Fava)    Sexual activity: Not on file   Other Topics Concern    Not on file   Social History Narrative    Not on file     Social Determinants of Health     Financial Resource Strain: Not on file   Food Insecurity: Not on file   Transportation Needs: Not on file   Physical Activity: Not on file   Stress: Not on file   Social Connections: Not on file   Intimate Partner Violence: Not on file   Housing Stability: Not on file       Family History:  No family history on file.    Medications:  Patient medication list reviewed  This patient does not have an active medication from one of the medication groupers.    Allergies:    Patient has no known allergies.    Review of Systems   Constitutional:  Negative for  activity change, appetite change, chills, diaphoresis, fatigue, fever and unexpected weight change.   HENT:  Negative for congestion, dental problem, drooling, ear discharge, ear pain, facial swelling, hearing loss, mouth sores, nosebleeds, postnasal drip, rhinorrhea, sinus pressure, sinus pain, sneezing, sore throat, tinnitus, trouble swallowing and voice change.    Eyes:  Negative for photophobia, pain, discharge, redness, itching and visual disturbance.   Respiratory:  Negative for apnea, cough, choking, chest tightness, shortness of breath, wheezing and stridor.    Cardiovascular:  Negative for chest pain, palpitations and leg swelling.   Gastrointestinal:  Negative for abdominal distention, abdominal pain, anal bleeding, blood in stool, constipation, diarrhea, nausea, rectal pain and vomiting.   Genitourinary:  Negative for decreased urine volume, difficulty urinating, dysuria, enuresis, flank pain, frequency, genital sores, hematuria, penile discharge, penile pain, penile swelling, scrotal swelling, testicular pain and urgency.   Musculoskeletal:  Negative for arthralgias, back pain, gait problem, joint  swelling, myalgias, neck pain and neck stiffness.   Skin:  Negative for color change, pallor, rash and wound.   Neurological:  Positive for headaches. Negative for dizziness, tremors, seizures, syncope, facial asymmetry, speech difficulty, weakness, light-headedness and numbness.   Psychiatric/Behavioral:  Negative for agitation, behavioral problems, confusion, decreased concentration, dysphoric mood, hallucinations, self-injury, sleep disturbance and suicidal ideas. The patient is not nervous/anxious and is not hyperactive.    All other systems reviewed and are negative.      Vitals:    12/13/21 2106   BP: 125/78   Pulse: 99   Resp: 18   Temp: 97.2 F (36.2 C)   SpO2: 99%   Weight: 170 lb (77.1 kg)   Height: 5\' 10"  (1.778 m)     Physical Exam  Vitals and nursing note reviewed.   Constitutional:       General:  He is not in acute distress.     Appearance: Normal appearance. He is normal weight. He is not ill-appearing, toxic-appearing or diaphoretic.   HENT:      Head: Normocephalic and atraumatic.      Right Ear: Tympanic membrane, ear canal and external ear normal. There is no impacted cerumen.      Left Ear: Tympanic membrane, ear canal and external ear normal. There is no impacted cerumen.      Nose: Nose normal. No congestion or rhinorrhea.      Mouth/Throat:      Mouth: Mucous membranes are moist.      Pharynx: Oropharynx is clear. No oropharyngeal exudate or posterior oropharyngeal erythema.   Eyes:      General: No scleral icterus.        Right eye: No discharge.         Left eye: No discharge.      Extraocular Movements: Extraocular movements intact.      Conjunctiva/sclera: Conjunctivae normal.      Pupils: Pupils are equal, round, and reactive to light.   Neck:      Vascular: No carotid bruit.   Cardiovascular:      Rate and Rhythm: Normal rate.      Pulses: Normal pulses.      Heart sounds: Normal heart sounds. No murmur heard.    No friction rub. No gallop.   Pulmonary:      Effort: Pulmonary effort is normal. No respiratory distress.      Breath sounds: Normal breath sounds. No stridor. No wheezing, rhonchi or rales.   Chest:      Chest wall: No tenderness.   Abdominal:      General: Abdomen is flat. Bowel sounds are normal. There is no distension.      Palpations: Abdomen is soft. There is no mass.      Tenderness: There is no abdominal tenderness. There is no right CVA tenderness, left CVA tenderness, guarding or rebound.      Hernia: No hernia is present.   Musculoskeletal:         General: No swelling, tenderness, deformity or signs of injury. Normal range of motion.      Cervical back: Normal range of motion and neck supple. No rigidity or tenderness.      Right lower leg: No edema.      Left lower leg: No edema.   Lymphadenopathy:      Cervical: No cervical adenopathy.   Skin:     General: Skin is warm  and dry.      Coloration: Skin is not  jaundiced or pale.      Findings: No bruising, erythema, lesion or rash.   Neurological:      General: No focal deficit present.      Mental Status: He is alert and oriented to person, place, and time. Mental status is at baseline.      Cranial Nerves: No cranial nerve deficit.      Sensory: No sensory deficit.      Motor: No weakness.      Coordination: Coordination normal.      Gait: Gait normal.      Deep Tendon Reflexes: Reflexes normal.   Psychiatric:         Mood and Affect: Mood normal.         Behavior: Behavior normal.         Thought Content: Thought content normal.         Judgment: Judgment normal.       Medical Decision Making:  Patient refusing IM shots opted for p.o. Tylenol.  Patient states headache is improving he withdrawal follow-up with his PCP this week.  He will return to the ED immediately for any worsening symptoms other concerns    ED Course:       Procedures    Vital Signs for this visit:  Patient Vitals for the past 12 hrs:   Temp Pulse Resp BP SpO2   12/13/21 2106 97.2 F (36.2 C) 99 18 125/78 99 %       Lab findings during this visit (only abnormal values will be noted, if no value noted then the result was normal range):  Labs Reviewed - No data to display    Radiology studies during this visit and the past 24 hours:  No results found.     Medications given in the ED:  Medications   ketorolac (TORADOL) injection 60 mg (has no administration in time range)   metoclopramide (REGLAN) injection 10 mg (has no administration in time range)   acetaminophen (TYLENOL) tablet 975 mg (has no administration in time range)       Diagnosis:  Final diagnoses:   Acute nonintractable headache, unspecified headache type           Condition at disposition:  improved    Disposition:  DISPOSITION Decision To Discharge 12/13/2021 09:34:10 PM      Discharge prescriptions and/or changes if applicable:       Medication List        ASK your doctor about these medications       chlorproMAZINE 100 MG tablet  Commonly known as: THORAZINE     hydrOXYzine pamoate 50 MG capsule  Commonly known as: VISTARIL     LORazepam 0.5 MG tablet  Commonly known as: ATIVAN     melatonin 3 MG Tabs tablet     OLANZapine 7.5 MG tablet  Commonly known as: ZYPREXA  Take 1 tablet by mouth nightly              Follow-up if applicable:  No follow-up provider specified.    Please note that portions of this document were created using the M*Modal Fluency Direct dictation system.  Any inconsistencies or typographical errors may be the result of mis-transcription that persist in spite of proof-reading and should be addressed with the document creator.       Jola Babinski, MD  12/13/21 2136

## 2021-12-13 NOTE — ED Notes (Signed)
Patient is alert, oriented, calm, and cooperative at time of discharge.  Medications, discharge instructions, and follow-up care discussed/reviewed with patient. Understanding is verbalized at this time.  Vital signs are stable at time of discharge -- out of range numbers discussed with care provider and patient is encouraged to follow up with PCP regarding concerns discussed.  Paramedic, patient, and MD in agreement with discharge plan. No safety concerns at this time.       Teresa Pelton  12/13/21 2151

## 2021-12-13 NOTE — ED Triage Notes (Signed)
Patient recently discharged from ED checking back in with wanting to see psych with questions about medications.    COVID + 8 days ago.

## 2021-12-14 ENCOUNTER — Inpatient Hospital Stay
Admit: 2021-12-14 | Discharge: 2021-12-14 | Disposition: A | Discharge status: Home / Self Care | Payer: MEDICARE | Attending: Emergency Medicine

## 2021-12-14 MED ORDER — METOCLOPRAMIDE HCL 5 MG/ML IJ SOLN
5 MG/ML | INTRAMUSCULAR | Status: DC
Start: 2021-12-14 — End: 2021-12-13

## 2021-12-14 MED ORDER — ACETAMINOPHEN 325 MG PO TABS
325 MG | ORAL | Status: AC
Start: 2021-12-14 — End: 2021-12-13
  Administered 2021-12-14: 02:00:00 975 mg via ORAL

## 2021-12-14 MED ORDER — KETOROLAC TROMETHAMINE 30 MG/ML IJ SOLN
30 MG/ML | INTRAMUSCULAR | Status: DC
Start: 2021-12-14 — End: 2021-12-13

## 2021-12-14 MED ORDER — OLANZAPINE 5 MG PO TABS
5 MG | ORAL_TABLET | Freq: Every evening | ORAL | 3 refills | Status: DC
Start: 2021-12-14 — End: 2021-12-14

## 2021-12-14 MED FILL — NON-ASPIRIN PAIN RELIEVER 325 MG PO TABS: 325 MG | ORAL | Qty: 3

## 2021-12-14 MED FILL — METOCLOPRAMIDE HCL 5 MG/ML IJ SOLN: 5 MG/ML | INTRAMUSCULAR | Qty: 2

## 2021-12-14 MED FILL — KETOROLAC TROMETHAMINE 30 MG/ML IJ SOLN: 30 MG/ML | INTRAMUSCULAR | Qty: 2

## 2021-12-14 NOTE — ED Notes (Signed)
Patient is alert, oriented, calm and cooperative.   Patient ambulates with a steady gait independently.  Medication, discharge instructions, and follow up care reviewed with patient and family who verbalized understanding.  Vital signs are stable.  RN, patient, MD, and family in agreement with discharge plan, no safety concerns noted.       Cleaster Corin, RN  12/14/21 [phone]

## 2021-12-14 NOTE — Consults (Signed)
BEHAVIORAL HEALTH CONSULT NOTE    NAME:  Eddie Morrison  DOB:     May 04, 1986  AGE:     36 y.o.  SEX:      Male  MR#:      [id number]  This interview was conducted using telemedicine audio and video. The patient was in the BED at [facility]. The provider was at home. They were the only 2 present.    CHIEF COMPLAINT/REASON FOR CONSULT: "I haven't been taking my meds."    HISTORY OF PRESENT ILLNESS:   Patient is a 36 y.o. male who lives in [place] and is planning on going back there by bus. He initially came to [facility] ED for a medical problem, was medically cleared, and discharged. He then immediately returned asking to see PMHNP. He reports that he has a hx of bipolar disorder and has not had his meds. He reports he has racing thoughts and feels disorganized. He denies SI/HI. He denies symptoms of depression. He denies symptoms consistent with mania or hypomania, a thought disorder, acute intoxication, withdrawal, or delirium. He is apparently on a type of ACT team in [place]. He denies using substances.      PAST PSYCHIATRIC HISTORY:   Psychiatric Provider:yes - on the PACT team -[facility]  Historical diagnoses:reports bipolar disorder  Therapist:yes - PACT team  Case Manager:yes - PACT team  History of Psychiatric Hospitalizations:yes - in [place] and [facility]  History of Suicide Attempts:no  History of Violence:no  Previous Medication Trials:olanzapine    CHEMICAL DEPENDENCY HISTORY:  Substances Used (Type, quantity, frequency, rout, last use):none  Past substance use history, including past detox and treatments:none  History of DTs or Seizures:no    FAMILY PSYCHIATRIC HISTORY:  Mgm has schizophrenia    SOCIAL HISTORY:  Social History     Socioeconomic History    Marital status: Single     Spouse name: None    Number of children: None    Years of education: None    Highest education level: None   Tobacco Use    Smoking status: Every Day     Packs/day: 0.50     Types: Cigarettes    Smokeless tobacco: Never   Vaping Use    Vaping Use: Never  used   Substance and Sexual Activity    Alcohol use: Yes    Drug use: Yes     Types: Marijuana (Weed)       Additional Social History:Born and raised in [place] by parents until he was 12y/o then by his mother. Has 2 adopted siblings, 1 full sibling, and 2 step siblings. Has had some college. Has worked in Special educational needs teacher. Never married. No children. Has been arrested for trespassing and has to go to court. No Financial planner.    History of Trauma:yes - did not elaborate    MEDICAL HISTORY:  Primary Care: On File Not (Inactive)  Medical Problems:  There is no problem list on file for this patient.      Surgical HX:  History reviewed. No pertinent surgical history.    Allergies: No Known Allergies    Hospital Current Medications:  No current facility-administered medications for this encounter.     Current Outpatient Medications   Medication Sig    chlorproMAZINE (THORAZINE) 100 MG tablet     hydrOXYzine pamoate (VISTARIL) 50 MG capsule     LORazepam (ATIVAN) 0.5 MG tablet Take 0.5 mg by mouth 2 times daily as needed.    melatonin 3 MG TABS  tablet Take 3 mg by mouth nightly as needed    OLANZapine (ZYPREXA) 7.5 MG tablet Take 1 tablet by mouth nightly       Home Medications:  @PTAMEDSLIST @    Tobacco:never smoked    LABS REVIEWED:  No results found for this or any previous visit (from the past 24 hour(s)).    All lab results for the last 24 hours reviewed.    MENTAL STATUS EXAM:    General Appearance:is unkempt  Speech:normal rate, tone and volume  Psychomotor:within normal limits  Affect:appropriate  Thought Process:goal directed  Thought Content:no hallucinations or delusions  Suicidal Thoughts:none  Homicidal/Violent Thoughts:none  Memory/Orientation:generally alert and oriented  Insight/Judgement:fair  Estimated Intelligence:Average    ASSESSMENT:  Patient is a 36 y.o. male who is here requesting a refill on his zyprexa 15mg  at [facility] for bipolar disorder. He initially came in for a medical problem. He  was cleared for discharge and then immediately readmitted himself for a psychiatric visit. He seems to have run out of zyprexa 15mg  at [facility]. He denies SI/HI. There is no evidence of psychosis or mania. He is stable to go back to [place] by bus and reconnect with providers there.    DIAGNOSIS:  Bipolar disorder    RECOMENDATIONS:    1. Zyprexa 15mg  at [facility] script sent in  2. Return prn  3. F/U with PACT team through [facility] [facility]      Recommendations were reviewed with attending provider.

## 2021-12-18 DIAGNOSIS — Z5900 Homelessness unspecified: Secondary | ICD-10-CM

## 2021-12-18 DIAGNOSIS — M546 Pain in thoracic spine: Secondary | ICD-10-CM

## 2021-12-18 DIAGNOSIS — F259 Schizoaffective disorder, unspecified: Principal | ICD-10-CM

## 2021-12-18 NOTE — ED Provider Notes (Signed)
 Provider in Triage Note    36 y.o. male with a PMH of schizoaffective disorder who presents to the ED with requests for CSU placement. Patient denies SI/HI, AH/VH. Patient endorses daily cannabis use and occasional alcohol use. Patient has previously been placed in a CSU. Has no medical complaints today.       Physical Exam: well appearing in NAD. CV: RRR, Lungs: CTAB    I have seen and evaluated this patient in triage.     Sharion Settler, PA        [place], [place]  12/18/21 2035

## 2021-12-18 NOTE — ED Triage Notes (Signed)
Pt just discharged from this ER. Reports side pain and back pain that started 30 mins ago. States it was not prevalant before, but now it is.

## 2021-12-18 NOTE — ED Triage Notes (Signed)
Pt looking for CSU placement. Denies SI/HI, hallucinations. Reports marijuana use only. States he has gone to a CSU before

## 2021-12-18 NOTE — ED Provider Notes (Signed)
 HPI   Chief Complaint   Patient presents with   . Psychiatric Evaluation       HPI  36 y/o male with history of schizoaffective disorder who presents for evaluation. Patient requesting CSU placement after returning from being out of town and has not been able to take his meds for the past 2-3 days. He states he gets his meds at a place in [place] and can get them tomorrow. Denies SI/HI/AH/VH. Endorses cannabis use and occasionally drinks EtOH. Currently patient is homeless and states he has been staying with friends. He has no medical complaints at this time.                 No data recorded                                Patient History   Past Medical History:   Diagnosis Date   . Schizoaffective disorder (CMS/HCC)      History reviewed. No pertinent surgical history.  No family history on file.  Social History     Tobacco Use   . Smoking status: Every Day     Packs/day: 0.50     Types: Cigarettes   . Smokeless tobacco: Never   Vaping Use   . Vaping status: Not on file   Substance Use Topics   . Alcohol use: Yes   . Drug use: Yes     Types: Marijuana       Review of Systems   Review of Systems    Physical Exam   ED Triage Vitals [12/18/21 2034]   Temp Pulse Resp BP   36.4 C (97.6 F) 86 18 124/76      SpO2 Temp src Heart Rate Source Patient Position   97 % -- -- --      BP Location FiO2 (%)     -- --       Physical Exam  Vitals and nursing note reviewed.   Constitutional:       General: He is not in acute distress.     Appearance: Normal appearance. He is well-developed and normal weight. He is not ill-appearing.   HENT:      Head: Normocephalic and atraumatic.   Eyes:      Extraocular Movements: Extraocular movements intact.      Conjunctiva/sclera: Conjunctivae normal.      Pupils: Pupils are equal, round, and reactive to light.   Cardiovascular:      Rate and Rhythm: Normal rate and regular rhythm.      Pulses: Normal pulses.      Heart sounds: Normal heart sounds. No murmur heard.  Pulmonary:      Effort:  Pulmonary effort is normal. No respiratory distress.      Breath sounds: Normal breath sounds. No wheezing or rales.   Abdominal:      General: Abdomen is flat. There is no distension.      Palpations: Abdomen is soft.      Tenderness: There is no abdominal tenderness. There is no guarding or rebound.   Musculoskeletal:         General: No swelling. Normal range of motion.      Cervical back: Normal range of motion and neck supple. No tenderness.   Skin:     General: Skin is warm and dry.      Capillary Refill: Capillary refill takes less than 2 seconds.  Neurological:      General: No focal deficit present.      Mental Status: He is alert and oriented to person, place, and time. Mental status is at baseline.   Psychiatric:         Mood and Affect: Mood normal.         Behavior: Behavior normal. Behavior is cooperative.         Thought Content: Thought content is not paranoid or delusional. Thought content does not include homicidal or suicidal ideation.       No orders to display       Labs Reviewed - No data to display    ED Course & MDM   Diagnoses as of 12/18/21 2220   Homelessness       MDM  36 y/o male with history of schizoaffective disorder who presents for evaluation.  On exam patient in no acute distress and is nontoxic-appearing.  Vital signs are stable and patient is afebrile.  No medical complaints at this time and physical exam is reassuring.  Patient denies SI/HI/AH/VH and is requesting CSU placement after reportedly not taking his medications for the past 2-3 days.  Patient does not meet CSU criteria and states that he would be able to go to [facility] tomorrow to get his medications.  Patient discharged in stable condition.          Caro Hight, PA  12/20/21 1359

## 2021-12-18 NOTE — ED Provider Notes (Addendum)
 HPI   Chief Complaint   Patient presents with   . Back Pain       HPI   this is a 36 year old male with a history of schizoaffective disorder who presents for evaluation of right-sided mid to low thoracic back pain.  The patient reports symptoms began, "oddly enough, I was seen here earlier tonight and began after I was discharged."  He did not have any trauma, but as he was walking away from the emergency department, he gradually developed moderately intense achy right mid to lower thoracic back pain, right of the paraspinal area, that radiates slightly forward.  It is worsened with movement of his torso.  He does not recall having had symptoms similar to this in the past.  No testicle pain.  No fevers chills nausea vomiting malaise.  No abdominal pain.  No chest pain shortness of breath or pleuritic pain.  No exertional dyspnea.  No leg swelling or calf pain.  No headache.  No suicidal or homicidal ideation.  Symptoms are worsened by movement of his torso and improved with staying still.    SOCIAL HISTORY: The patient denies intravenous drug use              Glasgow Coma Scale Score: 15                                  Patient History   Past Medical History:   Diagnosis Date   . Schizoaffective disorder (CMS/HCC)      History reviewed. No pertinent surgical history.  No family history on file.  Social History     Tobacco Use   . Smoking status: Every Day     Packs/day: 0.50     Types: Cigarettes   . Smokeless tobacco: Never   Vaping Use   . Vaping status: Not on file   Substance Use Topics   . Alcohol use: Yes   . Drug use: Yes     Types: Marijuana       Review of Systems   Review of Systems  No swollen glands.  No fever.  No headache.  No weight loss.  No chest pain.  No shortness of breath.  No exertional dyspnea.  No palpitations.  No pleuritic pain.  No abdominal pain nausea vomiting or diarrhea.  No leg swelling or calf pain.  No rash.  No numbness tingling or weakness.  No bowel or bladder  disturbance.  Physical Exam   ED Triage Vitals [12/18/21 2245]   Temp Pulse Resp BP   36.2 C (97.2 F) 90 16 119/78      SpO2 Temp src Heart Rate Source Patient Position   96 % -- -- --      BP Location FiO2 (%)     -- --       Physical Exam   Constitutional: Awake and alert, in no distress, resting comfortably, but obvious pain with movement of his torso.  Head without evidence of acute trauma.  Eyes without icterus.  Skin is warm dry without rash erythema or vesicular lesions to the affected area.  No wound abrasion or contusion.  Lungs good auscultation bilaterally, no rales rhonchi or wheezes, no accessory muscle use.  Heart regular rate and rhythm without murmur.  Abdomen is soft and nontender, no guarding or peritonitis, negative Murphy sign, no organomegaly.  Musculoskeletal: There is no bony thoracolumbar vertebral tenderness.  To the  right paraspinal muscle area, there is reproducible tenderness, without rib step-off or crepitus.  Extremities are without edema or calf tenderness.  Neuro: Awake and alert with a GCS of 15.  Strength is 5 out of 5 bilateral lower extremities, and his gait is normal.  Psych: No suicidal ideation.    No orders to display       Labs Reviewed - No data to display    ED Course & MDM   Diagnoses as of 12/19/21 0005   Acute right-sided thoracic back pain       Medical Decision Making  Acute right-sided thoracic back pain: acute illness or injury  Risk  OTC drugs.  Prescription drug management.  Diagnosis or treatment significantly limited by social determinants of health.        Well-appearing nontoxic afebrile 36 year old male, who is vital signs normal, who presents with mid to lower thoracic back pain, that began after his earlier discharge from the emergency department.  Differential diagnose includes musculoskeletal pain.  I considered spinal imaging, however, the patient has no high risk factors: No history of intravenous drug use, no fever, no neurologic signs or symptoms or  deficits on examination, no lymphadenopathy, no weight loss, no history of cancer.  With a normal neurological examination, and no neurologic symptoms, and no high risk factors, spinal imaging is not indicated.  In addition, there is no clinical evidence to suggest an intra-abdominal source, so abdominal imaging is not indicated.  No evidence to suggest a cardiothoracic source.  There is no evidence of a skin infection.  Indeed, his pain is reproducible on examination: There is no bony thoracic or lumbar vertebral tenderness, however, there is right paraspinal muscle tenderness to palpation, that reproduces symptoms.  I suspect musculoskeletal pain.  We will treat symptomatically with NSAID, Lidoderm patch, discharge, follow-up with PCP in 1 week, and return if worse.    DIAGNOSIS: Low back pain        Nelli Swalley B. Taylore Hinde, MD  12/19/21 0009       Faith Rogue. Arbie Blankley, MD  12/19/21 [id number]

## 2021-12-18 NOTE — ED Provider Notes (Signed)
 HPI   Chief Complaint   Patient presents with   . Psychiatric Evaluation       HPI    This is a 36 year old male who is presenting for further evaluation.  The patient has a history of schizoaffective disorder, for which he typically takes olanzapine and lorazepam.  He states that he typically receives care in the [place] area but has been recently visiting [place], homeless with staying with friends, and has not been with his medications for approximately 2 or 3 days.  He believes that if he is transferred to a CSU, they will be able to provide him with the medications.  He is otherwise without complaint: No fever or headache, no abdominal pain nausea or vomiting, no chest pain, no shortness of breath or cough.  He also denies visual auditory hallucinations, and has no suicidal or homicidal ideation.  He typically receives his medication in [place], so he can pick them up at his usual facility tomorrow.    Social HISTORY: The patient is homeless but is here staying with friends.  Typically resides in [place]              No data recorded                                Patient History   Past Medical History:   Diagnosis Date   . Schizoaffective disorder (CMS/HCC)      History reviewed. No pertinent surgical history.  No family history on file.  Social History     Tobacco Use   . Smoking status: Every Day     Packs/day: 0.50     Types: Cigarettes   . Smokeless tobacco: Never   Vaping Use   . Vaping status: Not on file   Substance Use Topics   . Alcohol use: Yes   . Drug use: Yes     Types: Marijuana       Review of Systems   Review of Systems  As noted above in the history of present illness; please also refer to the physician assistants notes.  Physical Exam   ED Triage Vitals [12/18/21 2034]   Temp Pulse Resp BP   36.4 C (97.6 F) 86 18 124/76      SpO2 Temp src Heart Rate Source Patient Position   97 % -- -- --      BP Location FiO2 (%)     -- --       Physical Exam   Constitutional: Awake and alert, resting  comfortably.  Head without evidence of acute trauma.  Eyes without icterus.  ENT: Oral mucosa is moist.  Lungs clear to auscultation bilaterally.  Heart regular rate and rhythm.  Neuro: Awake and alert with a GCS of 15.  Normal speech.  Steady gait.  Psych: There are no obvious hallucinations or delusions.  He denies suicidal or homicidal ideation.  He is calm and conversant, with good fluidity of speech.  There is no pressured speech.  He does not appear to be responding to internal stimuli.  Good eye contact / interrelatedness.     No orders to display       Labs Reviewed - No data to display    ED Course & MDM   Diagnoses as of 12/18/21 2239   Homelessness       Medical Decision Making  Homelessness: acute illness or injury  Risk  Diagnosis or  treatment significantly limited by social determinants of health.             This is a well-appearing nontoxic afebrile 36 year old male, who is vital signs are normal, who has a history of schizoaffective disorder, who is presenting for evaluation of schizoaffective disorder.  He believes that referral to a CSU can help him obtain his medications.  Here he is awake alert well-appearing nontoxic afebrile with normal vital signs.  In addition, he is pleasant conversant and calm, without visual auditory hallucinations, no apparent delusions, no suicidal or homicidal ideation.  He does not appear to be responding to internal stimuli.  Previous records reveals that review of his records reveals that patient was actually evaluated at [facility] in [place] earlier today, as well as twice yesterday, which is incongruent with the history that he provided.  Nonetheless, here he is well-appearing nontoxic resting calmly and comfortably, conversant, without suicidal or homicidal ideation, and at this time would not benefit from inpatient psychiatric evaluation and treatment or psychiatric consultation.  We have encouraged him to follow-up with his primary provider in  [place] tomorrow for refill of his medications.  Return if worse.    DIAGNOSIS: Schizoaffective disorder     Darling Cieslewicz B. Rohan Juenger, MD  12/19/21 1610

## 2021-12-18 NOTE — Discharge Instructions (Signed)
You were seen in the emergency department for evaluation.   You do not meet CSU criteria and recommend that you follow up with your [facility] team to get your medications.   Homelessness resources were provided to you.   Return to the emergency department for any new or concerning symptoms.

## 2021-12-19 ENCOUNTER — Inpatient Hospital Stay
Admit: 2021-12-19 | Discharge: 2021-12-19 | Disposition: A | Discharge status: Home / Self Care | Payer: MEDICARE | Attending: Emergency Medicine

## 2021-12-19 MED ORDER — acetaminophen (Tylenol) tablet 650 mg
325 | Freq: Once | ORAL | Status: AC
Start: 2021-12-19 — End: 2021-12-19
  Administered 2021-12-19: 04:00:00 650 mg via ORAL

## 2021-12-19 MED ORDER — lidocaine (Lidoderm) 5 % patch
5 | MEDICATED_PATCH | Freq: Every day | TOPICAL | 0 refills | 30.00000 days | Status: AC
Start: 2021-12-19 — End: ?

## 2021-12-19 MED ORDER — naproxen (Naprosyn) 500 mg tablet
500 | ORAL_TABLET | Freq: Two times a day (BID) | ORAL | 0 refills | 29.00000 days | Status: AC
Start: 2021-12-19 — End: 2021-12-27

## 2021-12-19 MED ORDER — lidocaine 4 % patch 1 patch
4 | Freq: Once | TOPICAL | Status: DC
Start: 2021-12-19 — End: 2021-12-19
  Administered 2021-12-19: 04:00:00 1 via TRANSDERMAL

## 2021-12-19 MED FILL — LIDOCAINE 4 % TOPICAL PATCH: 4 4 % | TOPICAL | Qty: 1

## 2021-12-19 MED FILL — ACETAMINOPHEN 325 MG TABLET: 325 325 mg | ORAL | Qty: 2

## 2021-12-19 NOTE — Other (Signed)
Patient Education   Table of Contents       Acute Back Pain, Adult     To view videos and all your education online visit,   https://pe.elsevier.com/[email or web address]   or scan this QR code with your smartphone.                    Acute Back Pain, Adult     Acute back pain is sudden and usually short-lived. It is often caused by an injury to the muscles and tissues in the back. The injury may result from:       A muscle, tendon, or ligament getting overstretched or torn. Ligaments are tissues that connect bones to each other. Lifting something improperly can cause a back strain.       Wear and tear (degeneration) of the spinal disks. Spinal disks are circular tissue that provide cushioning between the bones of the spine (vertebrae).       Twisting motions, such as while playing sports or doing yard work.       A hit to the back.       Arthritis.     You may have a physical exam, lab tests, and imaging tests to find the cause of your pain. Acute back pain usually goes away with rest and home care.   Follow these instructions at home:   Managing pain, stiffness, and swelling         Take over-the-counter and prescription medicines only as told by your health care provider. Treatment may include medicines for pain and inflammation that are taken by mouth or applied to the skin, or muscle relaxants.      Your health care provider may recommend applying ice during the first 24?48 hours after your pain starts. To do this:       Put ice in a plastic bag.       Place a towel between your skin and the bag.       Leave the ice on for 20 minutes, 2?3 times a day.       Remove the ice if your skin turns bright red. This is very important. If you cannot feel pain, heat, or cold, you have a greater risk of damage to the area.      If directed, apply heat to the affected area as often as told by your health care provider. Use the heat source that your health care provider recommends, such as a moist heat pack or a heating pad.       Place a  towel between your skin and the heat source.       Leave the heat on for 20?30 minutes.       Remove the heat if your skin turns bright red. This is especially important if you are unable to feel pain, heat, or cold. You have a greater risk of getting burned.     Activity           Do not  stay in bed. Staying in bed for more than 1?2 days can delay your recovery.      Sit up and stand up straight. Avoid leaning forward when you sit or hunching over when you stand.       If you work at a desk, sit close to it so you do not need to lean over. Keep your chin tucked in. Keep your neck drawn back, and keep your elbows bent at a 90-degree angle (right angle).  Sit high and close to the steering wheel when you drive. Add lower back (lumbar) support to your car seat, if needed.       Take short walks on even surfaces as soon as you are able. Try to increase the length of time you walk each day.      Do not  sit, drive, or stand in one place for more than 30 minutes at a time. Sitting or standing for long periods of time can put stress on your back.      Do not  drive or use heavy machinery while taking prescription pain medicine.      Use proper lifting techniques. When you bend and lift, use positions that put less stress on your back:       Fife your knees.       Keep the load close to your body.       Avoid twisting.       Exercise regularly as told by your health care provider. Exercising helps your back heal faster and helps prevent back injuries by keeping muscles strong and flexible.       Work with a physical therapist to make a safe exercise program, as recommended by your health care provider. Do any exercises as told by your physical therapist.     Lifestyle         Maintain a healthy weight. Extra weight puts stress on your back and makes it difficult to have good posture.       Avoid activities or situations that make you feel anxious or stressed. Stress and anxiety increase muscle tension and can make  back pain worse. Learn ways to manage anxiety and stress, such as through exercise.     General instructions         Sleep on a firm mattress in a comfortable position. Try lying on your side with your knees slightly bent. If you lie on your back, put a pillow under your knees.      Keep your head and neck in a straight line with your spine (neutral position) when using electronic equipment like smartphones or pads. To do this:       Raise your smartphone or pad to look at it instead of bending your head or neck to look down.       Put the smartphone or pad at the level of your face while looking at the screen.      Follow your treatment plan as told by your health care provider. This may include:       Cognitive or behavioral therapy.       Acupuncture or massage therapy.       Meditation or yoga.       Contact a health care provider if:         You have pain that is not relieved with rest or medicine.       You have increasing pain going down into your legs or buttocks.       Your pain does not improve after 2 weeks.       You have pain at night.       You lose weight without trying.       You have a fever or chills.       You develop nausea or vomiting.       You develop abdominal pain.     Get help right away if:  You develop new bowel or bladder control problems.       You have unusual weakness or numbness in your arms or legs.       You feel faint.     These symptoms may represent a serious problem that is an emergency. Do not wait to see if the symptoms will go away. Get medical help right away. Call your local emergency services (911 in the [place] Do not drive yourself to the hospital.   Summary         Acute back pain is sudden and usually short-lived.       Use proper lifting techniques. When you bend and lift, use positions that put less stress on your back.       Take over-the-counter and prescription medicines only as told by your health care provider, and apply heat or ice as told.     This  information is not intended to replace advice given to you by your health care provider. Make sure you discuss any questions you have with your health care provider.     Document Released: 12/18/2006Document Revised: 03/11/2022Document Reviewed: 11/19/2020     Elsevier Patient Education ? [place].

## 2021-12-29 NOTE — ED Notes (Signed)
Formatting of this note might be different from the original.  Patient woke up, alert, oriented x3, denies SOB or chest discomfort, moved to room 7 for sitter's watch  Electronically signed by Rose Fillers, RN at 12/29/2021  7:00 AM EDT
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
## 2021-12-29 NOTE — ED Notes (Signed)
Formatting of this note might be different from the original.  Observed to have apneic episodes while sleeping w/ desats to the upper70's.  Transferred to room w/ monitor, repositioned and HOB elevated. Pt changed into johnny.  Report given to Cam, RN.  Electronically signed by Eleanora Neighbor, RN at 12/29/2021  2:34 AM EDT

## 2021-12-29 NOTE — ED Triage Notes (Signed)
Formatting of this note might be different from the original.  Found in front of [employer]. Called 911 on self, stated he isn't feeling like himself. Told EMS he is from [place], in town visiting a friend.  [place]andering [place] w/ backpack.  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  Somnolent, snoring, rousable to speech but quickly falls back to sleep.Marland Kitchen +ETOH on board, unclear how much/last drink. +cannabis. Denied other drugs.Denied SI/SIB/HI/AH/VH. Denied somatic complaints.  Limited historian d/t level of intoxication/sedation.  Electronically signed by Jamie Kato, RN at 12/29/2021  1:50 AM EDT
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
## 2022-01-12 ENCOUNTER — Encounter: Admit: 2022-01-12 | Discharge: 2022-01-12 | Payer: BC Managed Care – PPO

## 2022-01-12 MED ORDER — VALACYCLOVIR 500 MG PO TAB
ORAL_TABLET | 3 refills
Start: 2022-01-12 — End: ?

## 2022-01-18 ENCOUNTER — Encounter: Admit: 2022-01-18 | Discharge: 2022-01-18 | Payer: BC Managed Care – PPO

## 2022-03-26 NOTE — ED Triage Notes (Signed)
Formatting of this note might be different from the original.  S: Pt went to ED 2 days ago found to have BP 38/65?, kept over night of hypotension. Pt reports tonight felt dizzy, weak while at a friends. Denies chest pain, sob, n/v/d, abd pain, back. Pt reports he felt well until tonight, was eating and drinking well today.    O: Pt awake A&Ox,4 rr even unlabored, speaking in full clear sentences. Skin warm, dry well perfused. Well appearing.   Electronically signed by Randall Hiss, RN at 03/26/2022 10:40 PM EDT

## 2022-03-27 LAB — BMP (EXT)
Anion Gap (EXT): 9 mmol/L (ref 3–17)
BUN (EXT): 6 mg/dL — ABNORMAL LOW (ref 8–25)
CO2 (EXT): 25 mmol/L (ref 23–32)
CalciumCalcium (EXT): 8.5 mg/dL (ref 8.5–10.5)
Chloride (EXT): 108 mmol/L (ref 98–108)
Creatinine (EXT): 0.98 mg/dL (ref 0.60–1.50)
GFR Estimated (Calc) (EXT): 102 mL/min/{1.73_m2} (ref >59)
Glucose (EXT): 112 mg/dL — ABNORMAL HIGH (ref 70–110)
Potassium (EXT): 3.9 mmol/L (ref 3.4–5.0)
Sodium (EXT): 142 mmol/L (ref 135–145)

## 2022-03-27 NOTE — ED Provider Notes (Signed)
Formatting of this note is different from the original.    RITHY MANDLEY is a 36 y.o. male with a PMH of bipolar disorder, recently incarcerated, schizoaffective disorder, who presents for general weakness, lightheadedness, and dizziness. Patient endorses smoking marijuana and drinking wine this evening. Patient states that he had a similar episode on 7/14 and was kept overnight at the [facility]. He denies loss of consciousness, shortness of breath, chest pain, or nausea.     ED Course  ED Course as of 03/27/22 0852   Mon Mar 27, 2022   0116 I assumed care of this patient from Jennelle Human, [place].    [phone] M  Bipolar, AUD  CC: Weak and lightheadedness since today.  No syncope.  [facility] 2 days ago with similar symptoms.  BP was "36/68"- actually there for ETOH and stayed overnight.  No intoxication in ED.  No vocal complaints.    Plan:  []  Labs  []  IV Fluids- tachy in ED.  --DC Home. [MG]   0119 Care transferred to [facility]. 0120 PA-C prior to [facility] and final dispo [JK]   0136 WBC: 5.95 [MG]   0136 Hgb(!): 12.9 [MG]   0136 HCT(!): 38.4 [MG]   0136 Sodium: 142 [MG]   0136 Potassium: 3.9 [MG]   0136 Creatinine: 0.98 [MG]   0136 Anion Gap: 9 [MG]   0252 Heart Rate: 98  Improvement after IV Fluids. [MG]   Carlynn Purl Patient's workup in the Emergency Department is negative.  His vital signs have improved with IV Fluids.  Patient was provided return precautions for the Emergency Department.  He will be discharged with outpatient PCP follow up. [MG]     ED Course User Index  [JK] [employer], PA-C  [MG] [id number], PA-C       Clinical Impressions as of 03/27/22 Hannah Beat   Lightheadedness     MDM  This is a 36 y.o. male presenting today with weakness and lightheadedness.    On exam, patient is well-appearing, and is able to sit up with no weakness. Vitals are remarkable to tachycardia to 102.    Patient does endorse EtOH, and marijuana use today. Vitals show patient is mildly tachycardic to 102, which likely  is relate to dehydration in the setting of EtOH use. There are no high-risk features for concern of ischemia. Will plan to hydrate and reassess.     Attestation:   I personally saw the patient as part of a shared visit with the advanced practice practitioner. I performed a substantive portion of the visit including all aspects of the medical decision making as documented.   I, [id number], MD, was present during the time this encounter was recorded by the scribe and do hereby attest that this information was reviewed and is true, accurate and complete.    Category 2 and 3: Independent Interpretation of Tests, Consideration of Tests, or External Discussion of Results:     Labs:  Laboratory studies were interpreted.    ECG:  EKG studies were independently interpreted.    Disposition Discharged    I, 31, am acting as scribe for Farrel Conners MD, for patient BRENNON OTTERNESS on 03/27/2022. - Sharol Harness, MD  04/03/22 [phone]    Electronically signed by 04/05/22, MD at 04/03/2022  6:36 AM EDT

## 2022-03-27 NOTE — ED Provider Notes (Signed)
Formatting of this note is different from the original.  Images from the original note were not included.      [facility] Emergency Department     History of Present Illness  Eddie Morrison is a 36 y.o. male with h/o bipolar disorder who presents with weakness and lightheadedness.  Symptoms started today.  He denies syncope, severe headache, chest pain, shortness of breath, abdominal pain, nausea, vomiting, diarrhea, or urinary complaints.  States that he was seen at [facility] 2 days ago for similar symptoms.  He was told that his blood pressure was 36/65 and was kept overnight.  Denies daily alcohol use.  States he smokes marijuana but denies any other drugs.  Notes that he was recently incarcerated and was released 2 days ago.  He has otherwise been feeling well.  Eating and drinking well.  States that his moods are under control.  Denies SI/HI.    Past Medical History  No past medical history on file.  There is no problem list on file for this patient.    Past Surgical History  No past surgical history on file.    Social History  Social History     Tobacco Use   ? Smoking status: Every Day     Packs/day: 0.25     Types: Cigarettes   ? Smokeless tobacco: Never   Substance Use Topics   ? Alcohol use: Never     Social History     Substance and Sexual Activity   Drug Use Yes   ? Types: Marijuana     Family History  No family history on file.    Medications    Patient's Medications    No medications on file     Allergies  Not on File    ROS    Constitutional: Negative for fever and chills.   HENT: Negative for congestion and rhinorrhea.    Eyes: Negative for visual disturbance.   Respiratory: Negative for cough and shortness of breath.    Cardiovascular: Negative for chest pain and palpitations.   Gastrointestinal: Negative for nausea, vomiting, abdominal pain, diarrhea and constipation.   Genitourinary: Negative for dysuria and hematuria.   Musculoskeletal: Negative for edema.  Skin: Negative for rash.    Neurological: Negative for dizziness, numbness and headaches.     Physical Exam  VS: BP 138/74   Pulse (!) 102   Temp 36.2 C (97.2 F) (Temporal)   Resp 17   SpO2 98%     Physical Exam    General: Patient is awake and alert. Patient is oriented x3. Patient is nontoxic appearing.  Head: The head is normocephalic and atraumatic.  Eyes: The extraocular muscles are intact.  ENT: Patient's airway is intact.  Neck: Supple.  Chest/Respiratory: Respiratory effort is normal. The lungs are clear to auscultation bilaterally.   Cardiovascular:  The heart sounds have a normal S1/S2. The heart has a regular rate and rhythm.   GI/Abdomen: Abdomen is soft. The abdomen is non-distended and non-tender.   GU: No CVA tenderness.  Musculoskeletal: Patient does not have edema.  Skin: The patient's skin is intact.   Neurologic: The neurological exam shows no focal deficits.    Assessment and Plan     No consults were ordered    If consults were ordered, refer to the consult documentation for additional information.    36 y.o. male presenting with weakness and lightheadedness.  He has no other localizing symptoms.  On chart review from outside  hospital from his visit 2 days ago, it appears that he was intoxicated requiring need to sober likely from alcohol use.  I cannot find documentation of hypotension.  No acute safety concerns.  He is not visibly intoxicated at this time.  Will check EKG, labs, and provide IV fluids.  Anticipate discharge home pending work-up.    ED Course  ED Course as of 03/27/22 0120   Mon Mar 27, 2022   0116 I assumed care of this patient from Jennelle Human, [place].    [phone] M  Bipolar, AUD  CC: Weak and lightheadedness since today.  No syncope.  [facility] 2 days ago with similar symptoms.  BP was "36/68"- actually there for ETOH and stayed overnight.  No intoxication in ED.  No vocal complaints.    Plan:  []  Labs  []  IV Fluids- tachy in ED.  --DC Home. [MG]   0119 Care transferred to [facility] 0120 prior to  [facility] and final dispo [JK]     ED Course User Index  [JK] Ludolph Broom, PA-C  [MG] [facility], PA-C       Clinical Impressions as of 03/27/22 0120   Lightheadedness     Schedule II medications prescribed upon discharge: None    Patient seen and discussed with ED Attending.    Diagnosis/Diagnoses  1. Lightheadedness      Disposition  Care transferred to other provider.    Hannah Beat PA-C  Emergency Medicine          03/29/22, PA-C  03/27/22 0120    Electronically signed by Sheppard Penton, PA-C at 03/27/2022  1:20 AM EDT

## 2022-04-19 ENCOUNTER — Inpatient Hospital Stay
Admit: 2022-04-19 | Discharge: 2022-04-19 | Disposition: A | Discharge status: Home / Self Care | Payer: MEDICARE | Attending: Emergency Medicine

## 2022-04-19 DIAGNOSIS — R002 Palpitations: Secondary | ICD-10-CM

## 2022-04-19 NOTE — Unmapped (Incomplete)
HPI   Chief Complaint   Patient presents with   . Palpitations       HPI  36 y.o. pt with h/o schizoaffective disorder presents to the ED for palpitations. Earlier today, he felt palpitations, chest pain and shortness of brother that lasted 10-15 minutes after taking Olanzapine. Pt says he was at the [place] and not doing any physical activity. The symptoms disappeared spontaneously. Pt did not lose consciousness. He has no fever or chills. No GI or GU symptoms, no dizziness or lightheadedness.             Glasgow Coma Scale Score: 14                                  Patient History   Past Medical History:   Diagnosis Date   . Schizoaffective disorder (CMS/HCC)      History reviewed. No pertinent surgical history.  No family history on file.  Social History     Tobacco Use   . Smoking status: Every Day     Packs/day: 0.50     Types: Cigarettes   . Smokeless tobacco: Never   Substance Use Topics   . Alcohol use: Not Currently   . Drug use: Yes     Types: Marijuana       Review of Systems   Review of Systems   Constitutional: Negative.    HENT: Negative.    Eyes: Negative.    Respiratory: Negative.    Cardiovascular: Negative.    Gastrointestinal: Negative.    Endocrine: Negative.    Genitourinary: Negative.    Musculoskeletal: Negative.    Neurological: Negative.        Physical Exam   ED Triage Vitals [04/19/22 0128]   Temp Pulse Resp BP   36.6 C (97.9 F) 103 18 105/68      SpO2 Temp Source Heart Rate Source Patient Position   (!) 93 % Tympanic -- --      BP Location FiO2 (%)     -- --       Physical Exam  Constitutional:       Comments: Pt drowsy but arousable, says he is just tired from not having enough sleep    Cardiovascular:      Rate and Rhythm: Normal rate and regular rhythm.      Pulses: Normal pulses.      Heart sounds: Normal heart sounds.   Pulmonary:      Effort: Pulmonary effort is normal.      Breath sounds: Normal breath sounds.   Abdominal:      General: Abdomen is flat.      Palpations: Abdomen is  soft.   Neurological:      General: No focal deficit present.       No orders to display       Labs Reviewed - No data to display    ED Course & MDM   ED Course as of 04/19/22 0302   Wed Apr 19, 2022   0206 SpO2(!): 93 %   0234 ECG 12 lead   0249 ECG 12 lead     Repeat O2 Sat showed 95%.     36 y.o. pt with h/o schizoaffective disorder presents to the ED after an episode of palpitations, chest pain and SOB. Here, pt seems comfortable and his symptoms are not present anymore. Vital signs are wnl, physical exam is normal.  ECG shows sinus rhythm and early repolarization with no signs of ischemia.     Pt will be discharged.      MDM

## 2022-04-19 NOTE — Other (Signed)
Patient Education   Table of Contents     Palpitations     To view videos and all your education online visit,   [email or web address]   or scan this QR code with your smartphone.   Access to this content will expire in one year.                    Palpitations     Palpitations are feelings that your heartbeat is not normal. Your heartbeat may feel like it is:     Uneven (irregular).     Faster than normal.     Fluttering.     Skipping a beat.     This is usually not a serious problem. However, a doctor will do tests and check your medical history to make sure that you do not have a serious heart problem.   Follow these instructions at home:   Watch for any changes in your condition. Tell your doctor about any changes. Take these actions to help manage your symptoms:   Eating and drinking  Follow instructions from your doctor about things to eat and drink. You may be told to avoid these things:     Drinks that have caffeine in them, such as coffee, tea, soft drinks, and energy drinks.     Chocolate.     Alcohol.     Diet pills.     Lifestyle           Try to lower your stress. These things can help you relax:     Yoga.     Deep breathing and meditation.     Guided imagery. This is using words and images to create positive thoughts.     Exercise, including swimming, jogging, and walking. Tell your doctor if you have more abnormal heartbeats when you are active. If you have chest pain or feel short of breath with exercise, do not keep doing the exercise until you are seen by your doctor.     Biofeedback. This is using your mind to control things in your body, such as your heartbeat.     Get plenty of rest and sleep. Keep a regular bed time.    Do not  use drugs, such as cocaine or ecstasy. Do not  use marijuana.    Do not  smoke or use any products that contain nicotine or tobacco. If you need help quitting, ask your doctor.       General instructions     Take over-the-counter and prescription medicines only  as told by your doctor.     Keep all follow-up visits. You may need more tests if palpitations do not go away or get worse.       Contact a doctor if:       You keep having fast or uneven heartbeats for a long time.     Your symptoms happen more often.     Get help right away if:       You have chest pain.     You feel short of breath.     You have a very bad headache.     You feel dizzy.     You faint.     These symptoms may be an emergency. Get help right away. Call your local emergency services (911 in the [place]    Do not wait to see if the symptoms will go away.    Do not drive yourself to the hospital.  Summary       Palpitations are feelings that your heartbeat is uneven or faster than normal. It may feel like your heart is fluttering or skipping a beat.     Avoid food and drinks that may cause this condition. These include caffeine, chocolate, and alcohol.     Try to lower your stress. Do not  smoke or use drugs.     Get help right away if you faint, feel dizzy, feel short of breath, have chest pain, or have a very bad headache.     This information is not intended to replace advice given to you by your health care provider. Make sure you discuss any questions you have with your health care provider.     Document Released: 06/06/2008 Document Revised: 01/19/2021 Document Reviewed: 01/19/2021     Elsevier Patient Education ? 2023 Else[employer] Inc[employer]

## 2022-04-19 NOTE — ED Provider Notes (Signed)
EMERGENCY DEPARTMENT ENCOUNTER    ATTENDING NOTE  Date of service: 04/19/2022  1:58 AM    HISTORY OF PRESENT ILLNESS:    36 year old male with a PMHx of schizoaffective disorder presents for evaluation of palpitations. Patient reports taking 15mg  of olanzapine and feeling palpitations afterwards. He otherwise endorses mild nausea, though denies chest pain or SOB.      PAST MEDICAL HISTORY  Past Medical History:   Diagnosis Date   . Schizoaffective disorder (CMS/HCC)      There is no problem list on file for this patient.    History reviewed. No pertinent surgical history.    MEDICATIONS  Previous Medications    CHLORPROMAZINE (THORAZINE) 100 MG TABLET    Take by mouth in the morning, at noon, and at bedtime.    HYDROXYZINE PAMOATE (VISTARIL) 25 MG CAPSULE    Take 25 mg by mouth if needed in the morning, at noon, and at bedtime for itching.    LIDOCAINE (LIDODERM) 5 % PATCH    Apply 1 patch topically once daily. Remove & discard patch within 12 hours or as directed by MD.    LORAZEPAM (ATIVAN) 1 MG TABLET    Take 1 mg by mouth every 6 (six) hours if needed for anxiety.    OLANZAPINE (ZYPREXA) 10 MG TABLET    Take by mouth at bedtime.        ALLERGIES  No Known Allergies    SOCIAL HISTORY  Social History     Tobacco Use   . Smoking status: Every Day     Packs/day: 0.50     Types: Cigarettes   . Smokeless tobacco: Never   Substance Use Topics   . Alcohol use: Not Currently     Social History     Substance and Sexual Activity   Drug Use Yes   . Types: Marijuana       FAMILY HISTORY  No family history on file.      REVIEW OF SYSTEMS  Review of Systems      PHYSICAL EXAM     ED Triage Vitals [04/19/22 0128]   Temp Pulse Resp BP   36.6 C (97.9 F) 103 18 105/68      SpO2 Temp Source Heart Rate Source Patient Position   (!) 93 % Tympanic -- --      BP Location FiO2 (%)     -- --          Constitutional: Well-nourished, well-developed, appears stated age  Eyes: No conjunctival injection, symmetrical eyelids  ENT:  Edentulous  Neck:  Symmetric, trachea midline, no thyromegaly  Respiratory: No respiratory distress  Musculoskeletal: Normocephalic, atraumatic, extremities without deformity or tenderness to palpation  Skin: Warm, dry, no rashes or lesion.      LABS  Labs Reviewed - No data to display     RADIOLOGY  No orders to display       EKG    EKG independently visualized and interpreted by me:  Normal sinus rhythm no ischemic changes  According to subtle anterior STEMI calculator likely BER with a score of 16    ED COURSE  Diagnoses as of 04/19/22 0320   Palpitations         MEDICAL DECISION MAKING  Nursing notes reviewed and agreed with.    36 year old male presents for evaluation of palpitations. Physical exam is within normal limits and EKG was not concerning for acute pathology. Patient slept in the ED for several  hours and ultimately his symptoms resolved without intervention. He was discharged in stable condition with good return precautions.    INDEPENDENT INTERPRETATIONS  EKG independently interpreted by me, details documented in the chart above.    CHRONIC CONDITIONS  Schizoaffective disorder    ACUTE CONDITIONS  Palpitations      CLINICAL IMPRESSION:    1. Palpitations          By signing my name below, I, Verneita Griffes, attest that this documentation has been prepared under the direction and presence of Dr. Lorine Bears, DO.       Electronically signed: Verneita Griffes, Scribe. 04/19/22 3:20 AM.    Consent to use a scribe was obtained prior to the start of the encounter by clinical staff.     Charlyne Quale, MD  04/19/22 [phone]

## 2022-04-19 NOTE — Discharge Instructions (Addendum)
You were seen in the ED for an episode of resolved palpitations, chest pain and shortness of breath. Your EKG is not concerning of ischemic abnormalities. You will be discharged and we advise that you come back to the ED if you are having constant crushing chest pain, shortness of breath or symptoms that appear with physical activity.

## 2022-04-19 NOTE — ED Triage Notes (Signed)
Pt reports to the ED after taking old medication, Olanzapine 15 mg and feeling palpitations. Pt denies chest pain or SOB. Pt speaking in full sentences. Pt also reporting nausea.

## 2022-08-04 IMAGING — CT CT head/brain wo con
3 series · 16 of 47 positions shown, 19 images · non-contrast
Comparison: None

CT brain noncontrast
CLINICAL HISTORY: fall [DATE] feet (Hx)
TECHNIQUE: Contiguous axial images through the posterior fossa and supratentorial compartment, noncontrast. Sagittal and coronal reconstructions were submitted for interpretation.
The examination was performed with the adjustment of mA according to the patient size and/or the use of Iterative Reconstruction Technique.

[Series 3: soft tissue wo · axial · 0.43mm/px · z∈[+10,+155]mm · 10 of 35 slices shown, 13 images]
[im 3/35  brain]
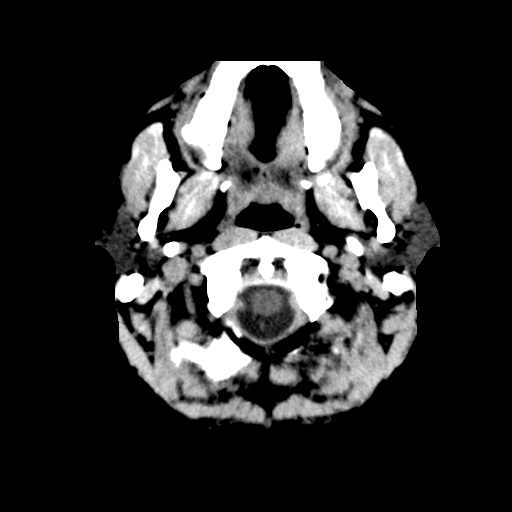
[im 3/35  bone]
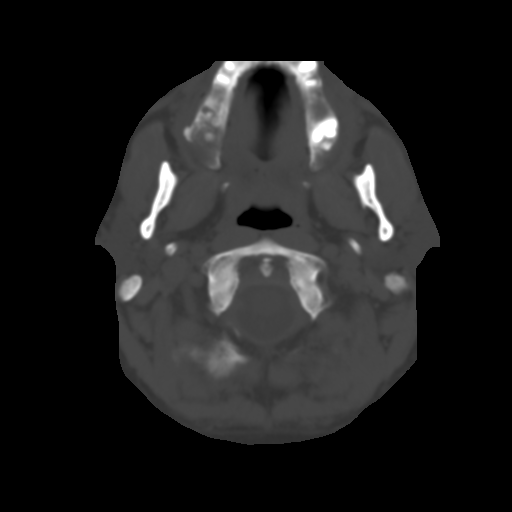
[im 6/35  brain]
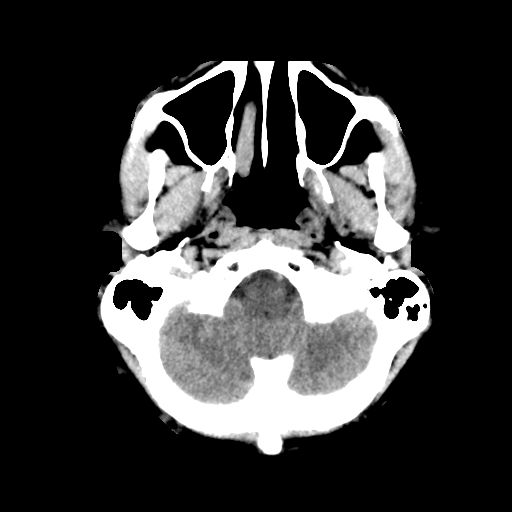
[im 10/35  brain]
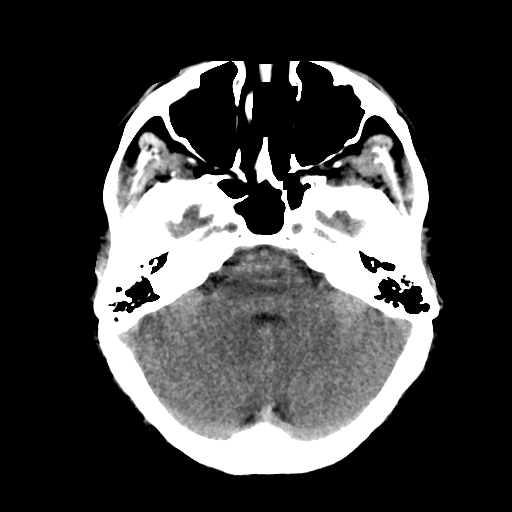
[im 12/35  brain]
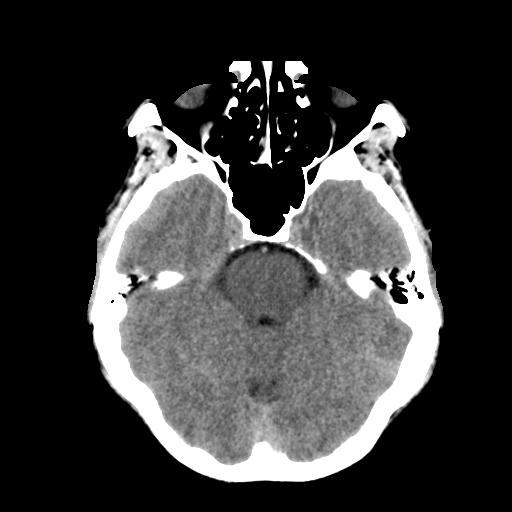
[im 16/35  brain]
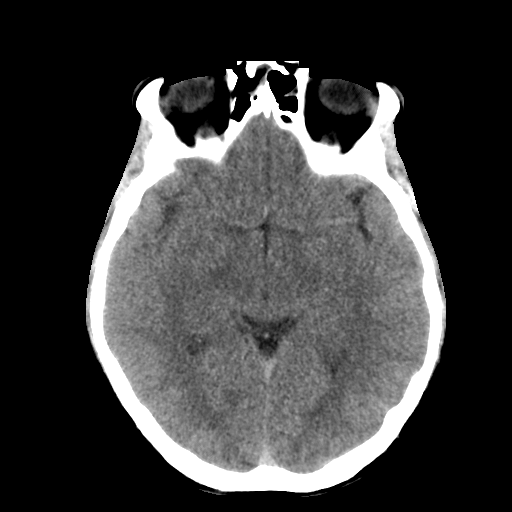
[im 16/35  bone]
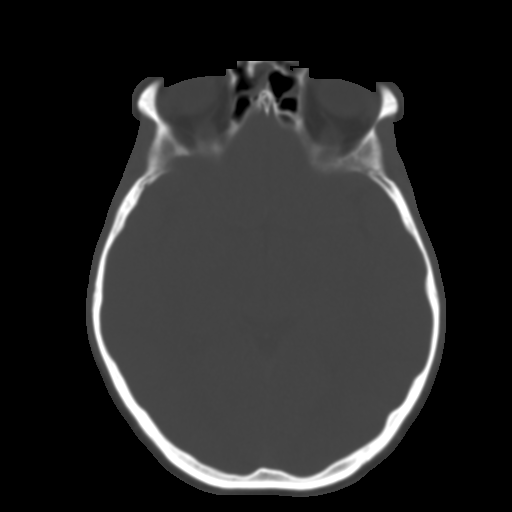
[im 19/35  brain]
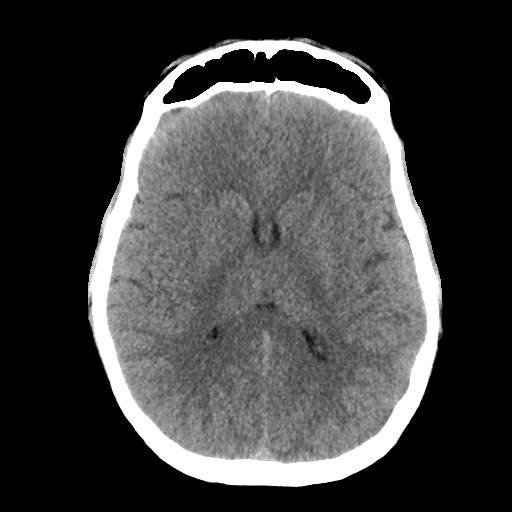
[im 23/35  brain]
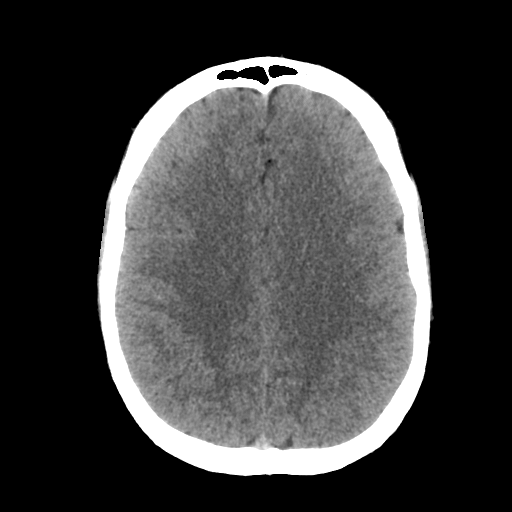
[im 26/35  brain]
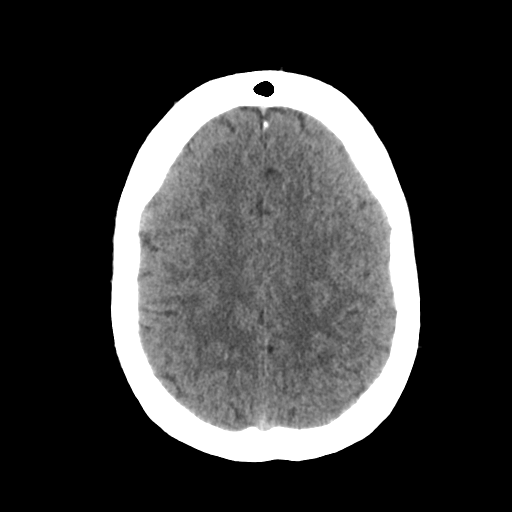
[im 29/35  brain]
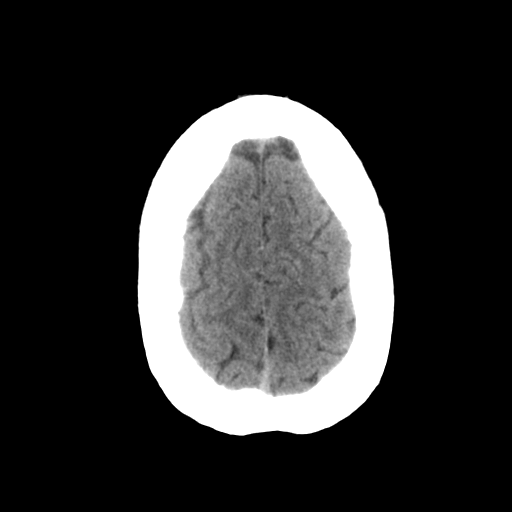
[im 29/35  bone]
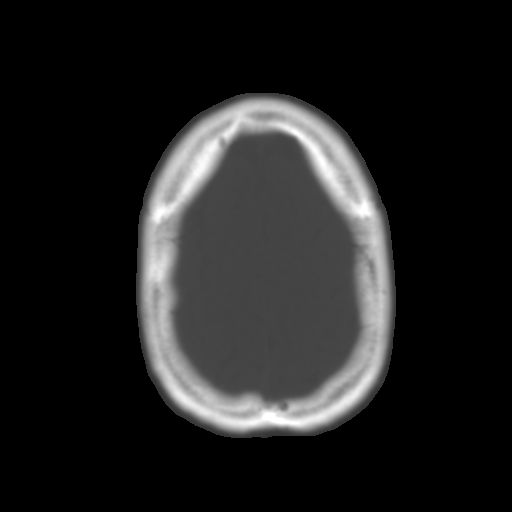
[im 32/35  brain]
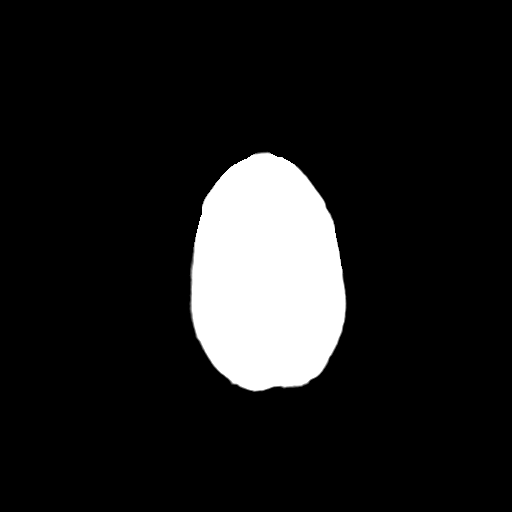

[Series 1001: coronal wo · coronal · 0.43mm/px · 3 of 71 slices shown]
[im 24/71  brain]
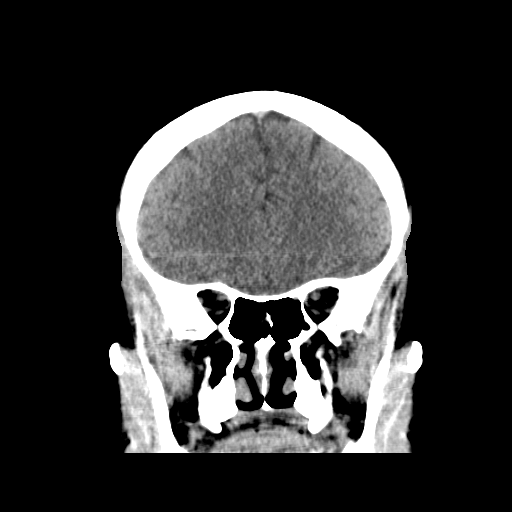
[im 32/71  brain]
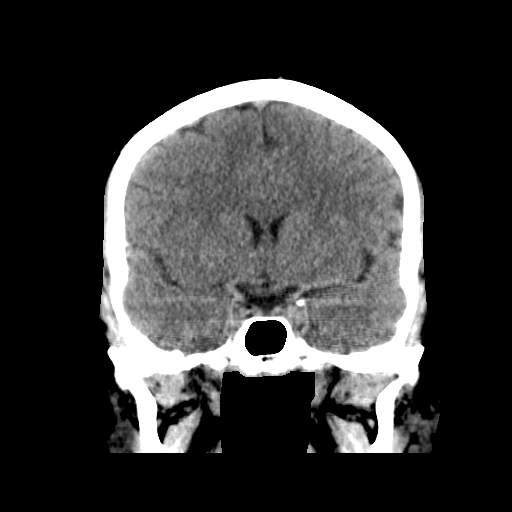
[im 39/71  brain]
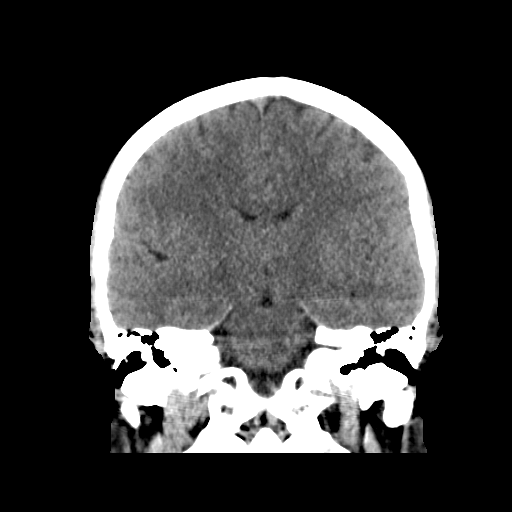

[Series 1002: sagittal wo · sagittal · 0.43mm/px · 3 of 67 slices shown]
[im 23/67  brain]
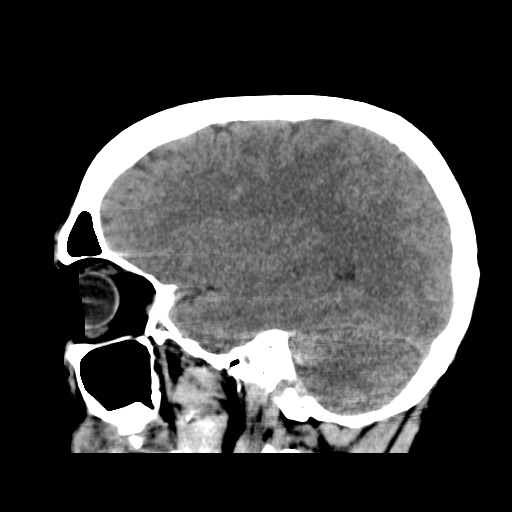
[im 34/67  brain]
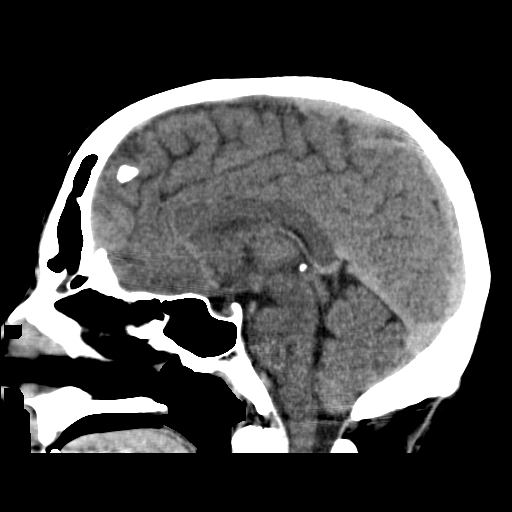
[im 45/67  brain]
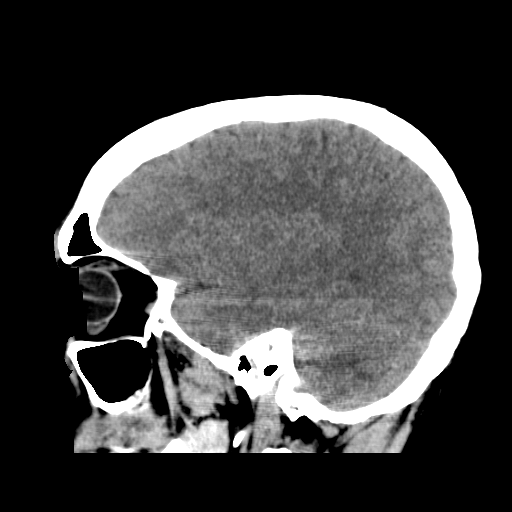

[16 of 47 positions shown; findings below may reference images not displayed]

FINDINGS: There is no evidence of intracranial mass effect, hemorrhage, or acute infarct. The lateral ventricles are symmetrical and the 4th ventricle is midline without shift. No acute brain parenchymal changes or extra-axial fluid collections are identified. The posterior fossa contents are within normal limits.
The calvarium is intact.  There is mild pansinus mucosal thickening with sparing of the sphenoid sinuses.  The bilateral mastoid air cells are patent.
IMPRESSION: No acute intracranial process.

## 2022-09-08 ENCOUNTER — Encounter: Admit: 2022-09-08 | Discharge: 2022-09-08 | Payer: BC Managed Care – PPO

## 2022-09-08 ENCOUNTER — Ambulatory Visit: Admit: 2022-09-08 | Discharge: 2022-09-09 | Payer: No Typology Code available for payment source

## 2022-09-08 DIAGNOSIS — U071 COVID-19: Secondary | ICD-10-CM

## 2022-09-22 DIAGNOSIS — R42 Dizziness and giddiness: Secondary | ICD-10-CM

## 2022-09-22 NOTE — ED Provider Notes (Signed)
Chief Complaint   Patient presents with    Dizziness       HPI  Hilda Wexler is a 37 y.o. yo male who presents with dizziness that is now resolved.  He states that he had a sense of room spinning dizziness earlier today.  It is now resolved.  He denies any cough, runny nose, chest pain, palpitations, shortness of breath, abdominal pain, nausea, vomiting or diarrhea.  No fever.  No head injury.  No recent injury to his neck.  Feels like he does not need to be here or need to be seen at this time.    Medical History:  Current Problem List:   There is no problem list on file for this patient.      Past Medical History:  History reviewed. No pertinent past medical history.    History reviewed. No pertinent surgical history.    Social History     Socioeconomic History    Marital status: Single     Spouse name: Not on file    Number of children: Not on file    Years of education: Not on file    Highest education level: Not on file   Occupational History    Not on file   Tobacco Use    Smoking status: Every Day     Current packs/day: 0.50     Types: Cigarettes    Smokeless tobacco: Never   Vaping Use    Vaping Use: Never used   Substance and Sexual Activity    Alcohol use: Yes    Drug use: Yes     Types: Marijuana Sherrie Mustache)    Sexual activity: Not on file   Other Topics Concern    Not on file   Social History Narrative    Not on file     Social Determinants of Health     Financial Resource Strain: Not on file   Food Insecurity: Not on file   Transportation Needs: Not on file   Physical Activity: Not on file   Stress: Not on file   Social Connections: Not on file   Intimate Partner Violence: Not on file   Housing Stability: Not on file       Family History:  History reviewed. No pertinent family history.    Medications:  Patient medication list reviewed  This patient does not have an active medication from one of the medication groupers.    No current facility-administered medications on file prior to encounter.     Current  Outpatient Medications on File Prior to Encounter   Medication Sig Dispense Refill    chlorproMAZINE (THORAZINE) 100 MG tablet       hydrOXYzine pamoate (VISTARIL) 50 MG capsule       LORazepam (ATIVAN) 0.5 MG tablet Take 0.5 mg by mouth 2 times daily as needed.      melatonin 3 MG TABS tablet Take 3 mg by mouth nightly as needed      OLANZapine (ZYPREXA) 7.5 MG tablet Take 1 tablet by mouth nightly 15 tablet 0       Allergies:    Patient has no known allergies.    Review of Systems see HPI      Vitals:    09/22/22 2033   BP: 122/79   Pulse: 88   Resp: 20   Temp: 97.9 F (36.6 C)   TempSrc: Temporal   SpO2: 95%   Weight: 81.6 kg (180 lb)   Height: 1.778 m (5\' 10" )  Physical Exam  General:  Sleeping sitting up in the chair but easily awakened and answers questions appropriately  HEENT: PERRLA, EOMI, normal conjuctiva  Neck: supple, nontender  Nodes:  no cervical adenopathy  OP: mucosa moist  Lungs: clear with good aeration; no wheezes, rales or ronchi  CV: RRR without murmur  Abd:  soft, NT, no HSM, no masses, normal BS  Back: No CVA tenderness and no midline tenderness  Ext: warm, dry, well perfused without edema  Skin: no acute rash  Neuro: CN 2-12 intact, normal motor and sensation, normal gait, normal speech, 2+= patellar reflexes  Pulses: 2+ = radial pulses              Additional information was gathered from the following independent historian(s):  - the following record sources:  -- this EMR (Care Everywhere)      Medical Decision Making:  Izsak Meir is a 37 y.o. yo male with past medical history of bipolar and schizoaffective disorder who presents with dizziness which is now resolved.  On review of his outside records he was seen for this previously and [place].  He states he is going back to [employer].  Has no abnormal finding on neurologic exam at this resolved.  He denies any head injury and there has no evidence of trauma.  He denies any chest pain or palpitations or other etiology for  symptoms.  He is afebrile.  Normal vital signs.  No further intervention required.  He is being discharged home with a diagnosis of dizziness that is resolved.    ED Course:     \    Procedures    Vital Signs for this visit:  Patient Vitals for the past 12 hrs:   Temp Pulse Resp BP SpO2   09/22/22 2033 97.9 F (36.6 C) 88 20 122/79 95 %       Lab findings during this visit (only abnormal values will be noted, if no value noted then the result was normal range):  Labs Reviewed - No data to display    Radiology studies during this visit and the past 24 hours:  No results found.    Medications given in the ED:  Medications - No data to display    Diagnosis:  Final diagnoses:   None       Disposition:  DISPOSITION        Discharge prescriptions and/or changes if applicable:       Medication List        ASK your doctor about these medications      chlorproMAZINE 100 MG tablet  Commonly known as: THORAZINE     hydrOXYzine pamoate 50 MG capsule  Commonly known as: VISTARIL     LORazepam 0.5 MG tablet  Commonly known as: ATIVAN     melatonin 3 MG Tabs tablet     OLANZapine 7.5 MG tablet  Commonly known as: ZYPREXA  Take 1 tablet by mouth nightly              Follow-up if applicable:  No follow-up provider specified.    Please note that portions of this document were created using the M*Modal Fluency Direct dictation system.  Any inconsistencies or typographical errors may be the result of mis-transcription that persist in spite of proof-reading and should be addressed with the document creator.        Mindi Slicker, MD  09/23/22 [phone]

## 2022-09-22 NOTE — ED Notes (Signed)
Pt was asleep in the waiting room when called. Pt awakened and roomed.

## 2022-09-22 NOTE — ED Notes (Signed)
Pt was asleep in hallway chair, awakened for d/c.     Pt was provided with written copy of discharge instructions.   Discharge instructions reviewed with pt; pt verbalized understanding.   Pt ambulated independently from department with steady gait.

## 2022-09-22 NOTE — Discharge Instructions (Signed)
Return to the ED for recurrence of symptoms, new or concerning symptoms or any other problems or concerns.

## 2022-09-22 NOTE — ED Triage Notes (Signed)
Pt. Arrives via EMS for dizziness while he was walking .  Pt. States the dizziness is worse with moving his head does not have a hx of vertigo states that he is going back to [place] tomorrow by bus . States he has no place to go until morning.

## 2022-09-23 ENCOUNTER — Inpatient Hospital Stay
Admit: 2022-09-23 | Discharge: 2022-09-23 | Disposition: A | Discharge status: Home / Self Care | Payer: PRIVATE HEALTH INSURANCE | Attending: Emergency Medicine

## 2022-09-24 DIAGNOSIS — S40011A Contusion of right shoulder, initial encounter: Secondary | ICD-10-CM

## 2022-09-24 DIAGNOSIS — S0990XA Unspecified injury of head, initial encounter: Secondary | ICD-10-CM

## 2022-09-24 NOTE — ED Provider Notes (Signed)
EMERGENCY DEPARTMENT ENCOUNTER    ATTENDING NOTE    Date of service: 09/24/2022 11:58 PM     HISTORY OF PRESENT ILLNESS:    37 y.o. male presents with right shoulder pain.  Patient also hit the back of his head after falling on the ice 2 days ago.    PERTINENT ROS  Right shoulder pain, denies any headache but has some pain at the back of his head.  Denies LOC, chest, abdominal, pelvic, leg or arm pain otherwise    PAST MEDICAL HISTORY / PROBLEM LIST    Past Medical History:   Diagnosis Date   . Schizoaffective disorder (CMS-HCC) (HHS-HCC)        There is no problem list on file for this patient.      PAST SURGICAL HISTORY    History reviewed. No pertinent surgical history.    MEDICATIONS     Previous Medications    CHLORPROMAZINE (THORAZINE) 100 MG TABLET    Take by mouth in the morning, at noon, and at bedtime.    HYDROXYZINE PAMOATE (VISTARIL) 25 MG CAPSULE    Take 25 mg by mouth if needed in the morning, at noon, and at bedtime for itching.    LIDOCAINE (LIDODERM) 5 % PATCH    Apply 1 patch topically once daily. Remove & discard patch within 12 hours or as directed by MD.    LORAZEPAM (ATIVAN) 1 MG TABLET    Take 1 mg by mouth every 6 (six) hours if needed for anxiety.    OLANZAPINE (ZYPREXA) 10 MG TABLET    Take by mouth at bedtime.        ALLERGIES    No Known Allergies    SOCIAL HISTORY    Social History     Tobacco Use   . Smoking status: Every Day     Packs/day: .5     Types: Cigarettes   . Smokeless tobacco: Never   Substance Use Topics   . Alcohol use: Not Currently     Social History     Substance and Sexual Activity   Drug Use Yes   . Types: Marijuana       FAMILY HISTORY    No family history on file.    [place]    ED Triage Vitals [09/24/22 2309]   Temp Pulse Resp BP   36.1 C (97 F) 80 17 105/69      SpO2 Temp src Heart Rate Source Patient Position   95 % -- -- --      BP Location FiO2 (%)     -- --          PHYSICAL EXAM    General appearance: alert, in no apparent distress  Head exam:  atraumatic  Eye exam: PERRLA  ENT exam: no facial trauma  Neck exam: Present: No cervical spine step offs. Tenderness absent   Chest inspection: Present: symmetric chest wall rise. No crepitus. Tenderness absent  Respiratory exam: Present: normal lung sounds bilaterally and equal bilaterally   Cardiovascular exam: Present: regular rate, normal rhythm    Abdominal exam: Present: soft.  Absent: tenderness  Pelvis exam: Pelvis stable   Extremities exam: Absent: injury or pain in either upper or lower extremity bilaterally   Back exam: Present: No step offs throughout vertebral spine and tenderness absent   Neurological exam: Present: no facial droop, moving all four extremities spontaneously, GCS 15  Skin exam: Present: warm, dry, abrasions or lacerations absent     MEDICAL  DECISION MAKING    Nursing notes reviewed and I agree.    TESTS AND WORKUP CONSIDERED (including any clinical decision rules)  X-ray of the right shoulder, CT of the head obtained    TREATMENT CONSIDERED    Ibuprofen and Tylenol given    INDEPENDENT INTERPRETATIONS    INDEPENDENT INTERPRETATIONS OF EKG, LABS, RADIOLOGY, OTHER (see ED COURSE for remainder)      Labs Reviewed - No data to display     No orders to display       [place] / DIAGNOSIS (and other differential diagnosis items as pertinent):      ED Course as of 09/26/22 0141   Mon Sep 25, 2022   0120 CT HEAD TRAUMA WO CONTRAST  IMPRESSION:    No acute territorial infarct, space-occupying lesion, or intracranial hemorrhage.   0121 XR SHOULDER RIGHT 2+ VIEWS  X-ray of the right shoulder, read by me, no acute findings: IMPRESSION:  No acute osseous abnormality.         Diagnoses as of 09/26/22 0141   Injury of head, initial encounter   Contusion of right shoulder, initial encounter       Disposition and Recommendations: Patient was discharged with return precautions       Alden Benjamin, MD  09/26/22 [phone]

## 2022-09-24 NOTE — Unmapped (Signed)
HPI   Chief Complaint   Patient presents with   . Fall       HPI 37 yo male phx Schizoaffective DO, fell on ice earlier today striking the back of his head, no LOC, no neck pain mild HA, no chest or abd pain, some right shoulder pain              Glasgow Coma Scale Score: 15                                  Patient History   Past Medical History:   Diagnosis Date   . Schizoaffective disorder (CMS-HCC) (HHS-HCC)      History reviewed. No pertinent surgical history.  No family history on file.  Social History     Tobacco Use   . Smoking status: Every Day     Packs/day: .5     Types: Cigarettes   . Smokeless tobacco: Never   Substance Use Topics   . Alcohol use: Not Currently   . Drug use: Yes     Types: Marijuana       Review of Systems   Review of Systems   Constitutional: Negative for chills, diaphoresis, fatigue and fever.   HENT: Negative for dental problem, ear pain, facial swelling, nosebleeds, postnasal drip, sneezing and sore throat.    Eyes: Negative for photophobia, pain, redness and itching.   Respiratory: Negative for choking, chest tightness, shortness of breath and stridor.    Gastrointestinal: Negative for abdominal pain, nausea and vomiting.   Genitourinary: Negative for flank pain, penile pain and testicular pain.   Musculoskeletal: Negative for arthralgias, gait problem, neck pain and neck stiffness.   Neurological: Negative for dizziness, tremors, seizures, facial asymmetry and headaches.   Psychiatric/Behavioral: Negative for agitation, behavioral problems, dysphoric mood, hallucinations, self-injury and sleep disturbance.       Physical Exam   ED Triage Vitals [09/24/22 2309]   Temp Pulse Resp BP   36.1 C (97 F) 80 17 105/69      SpO2 Temp src Heart Rate Source Patient Position   95 % -- -- --      BP Location FiO2 (%)     -- --       Physical Exam  Vitals and nursing note reviewed.   Constitutional:       General: He is not in acute distress.     Appearance: Normal appearance.   HENT:       Head: Normocephalic and atraumatic.      Right Ear: Tympanic membrane normal.      Nose: Nose normal.      Mouth/Throat:      Mouth: Mucous membranes are moist.   Eyes:      Extraocular Movements: Extraocular movements intact.      Conjunctiva/sclera: Conjunctivae normal.      Pupils: Pupils are equal, round, and reactive to light.   Cardiovascular:      Rate and Rhythm: Normal rate and regular rhythm.      Pulses: Normal pulses.      Heart sounds: Normal heart sounds. No murmur heard.  Pulmonary:      Effort: Pulmonary effort is normal. No respiratory distress.      Breath sounds: Normal breath sounds.   Abdominal:      Palpations: Abdomen is soft.      Tenderness: There is no abdominal tenderness.   Musculoskeletal:  General: No swelling, tenderness, deformity or signs of injury.      Cervical back: Normal range of motion and neck supple. No tenderness.   Skin:     General: Skin is warm.      Coloration: Skin is not jaundiced or pale.      Findings: No bruising or erythema.   Neurological:      General: No focal deficit present.      Mental Status: He is alert and oriented to person, place, and time.      Cranial Nerves: No cranial nerve deficit.      Sensory: No sensory deficit.      Motor: No weakness.      Gait: Gait normal.   Psychiatric:         Mood and Affect: Mood normal.         Behavior: Behavior normal.       No orders to display       Labs Reviewed - No data to display    ED Course & MDM   Diagnoses as of 09/25/22 0147   Injury of head, initial encounter   Contusion of right shoulder, initial encounter       MDM          Velna Hatchet, PA  09/25/22 [place], [place]  09/25/22 [phone]

## 2022-09-24 NOTE — ED Triage Notes (Signed)
Pt reports slip and fall earlier today while in [place] on ice. Reports landing on his right side. + head strike. No LOC. No thinners. Reports right arm pain. No deformity noted. Full movement noted to right upper extremity.

## 2022-09-25 ENCOUNTER — Emergency Department: Admit: 2022-09-25

## 2022-09-25 ENCOUNTER — Inpatient Hospital Stay
Admit: 2022-09-25 | Discharge: 2022-09-25 | Disposition: A | Discharge status: Home / Self Care | Attending: Emergency Medicine

## 2022-09-25 MED ORDER — ibuprofen tablet 600 mg
600 | Freq: Once | ORAL | Status: AC
Start: 2022-09-25 — End: 2022-09-25
  Administered 2022-09-25: 07:00:00 600 mg via ORAL

## 2022-09-25 MED ORDER — acetaminophen (Tylenol) tablet 975 mg
325 | Freq: Once | ORAL | Status: AC
Start: 2022-09-25 — End: 2022-09-25
  Administered 2022-09-25: 05:00:00 975 mg via ORAL

## 2022-09-25 MED FILL — IBUPROFEN 600 MG TABLET: 600 600 mg | ORAL | Qty: 1

## 2022-09-25 MED FILL — ACETAMINOPHEN 325 MG TABLET: 325 325 mg | ORAL | Qty: 3

## 2022-09-25 NOTE — Discharge Instructions (Signed)
Please call your doctor tomorrow

## 2022-09-25 NOTE — Other (Signed)
Patient Education  Table of Contents   Head Injury, Adult    To view videos and all your education online visit,  https://pe.elsevier.com/[email or web address]  or scan this QR code with your smartphone.  Access to this content will expire in one year.  Head Injury, Adult    There are many types of head injuries. Head injuries can be as minor as a small bump, or they can be a serious medical issue. More severe head injuries include:   A jarring injury to the brain (concussion).   A bruise (contusion) of the brain. This means there is bleeding in the brain that can cause swelling.   A cracked skull (skull fracture).   Bleeding in the brain that collects, clots, and forms a bump (hematoma).  After a head injury, most problems occur within the first 24 hours, but side effects may occur up to 7?10 days after the injury. It is important to watch your condition for any changes. You may need to be observed in the emergency department or urgent care, or you may be admitted to the hospital.  What are the causes?  There are many possible causes of a head injury. Serious head injuries may be caused by car accidents, bicycle or motorcycle accidents, sports injuries, falls, or being struck by an object.  What are the symptoms?  Symptoms of a head injury include a contusion, bump, or bleeding at the site of the injury. Other physical symptoms may include:   Headache.   Nausea or vomiting.   Dizziness.   Blurred or double vision.   Being uncomfortable around bright lights or loud noises.   Seizures.   Feeling tired.   Trouble being awakened.   Loss of consciousness.  Mental or emotional symptoms may include:   Irritability.   Confusion and memory problems.   Poor attention and concentration.   Changes in eating or sleeping habits.   Anxiety or depression.  How is this diagnosed?  This condition can usually be diagnosed based on your symptoms, a description of the injury, and a physical exam. You may also have imaging tests done, such as a CT  scan or an MRI.  How is this treated?  Treatment for this condition depends on the severity and type of injury you have. The main goal of treatment is to prevent complications and allow the brain time to heal.  Mild head injury  If you have a mild head injury, you may be sent home, and treatment may include:   Observation. A responsible adult should stay with you for 24 hours after your injury and check on you often.   Physical rest.   Brain rest.   Pain medicines.  Severe head injury  If you have a severe head injury, treatment may include:   Close observation. This includes hospitalization with the following care:  ? Frequent physical exams.  ? Frequent checks of how your brain and nervous system are working (neurological status).  ? Checking your blood pressure and oxygen levels.   Medicines to relieve pain, prevent seizures, and decrease brain swelling.   Airway protection and breathing support. This may include using a ventilator.   Treatments that monitor and manage swelling inside the brain.   Brain surgery. This may be needed to:  ? Remove a collection of blood or blood clots.  ? Stop the bleeding.  ? Remove a part of the skull to allow room for the brain to swell.  Follow these instructions at home:  Activity   Rest and avoid activities that are physically hard or tiring.   Make sure you get enough sleep.   Let your brain rest by limiting activities that require a lot of thought or attention, such as:  ? Watching TV.  ? Playing memory games and puzzles.  ? Job-related work or homework.  ? Working on [employer], and texting.   Avoid activities that could cause another head injury, such as playing sports, until your health care provider approves. Having another head injury, especially before the first one has healed, can be dangerous.   Ask your health care provider when it is safe for you to return to your regular activities, including work or school. Ask your health care provider for a  step-by-step plan for gradually returning to activities.   Ask your health care provider when you can drive, ride a bicycle, or use heavy machinery. Your ability to react may be slower after a brain injury. Do not do these activities if you are dizzy.  Lifestyle     Do not drink alcohol until your health care provider approves. Do not use drugs. Alcohol and certain drugs may slow your recovery and can put you at risk of further injury.   If it is harder than usual to remember things, write them down.   If you are easily distracted, try to do one thing at a time.   Talk with family members or close friends when making important decisions.   Tell your friends, family, a trusted colleague, and work Freight forwarder about your injury, symptoms, and restrictions. Have them watch for any new or worsening problems.  General instructions   Take over-the-counter and prescription medicines only as told by your health care provider.   Have someone stay with you for 24 hours after your head injury. This person should watch you for any changes in your symptoms and be ready to seek medical help.   Keep all follow-up visits as told by your health care provider. This is important.  How is this prevented?   Work on improving your balance and strength to avoid falls.   Wear a seat belt when you are in a moving vehicle.   Wear a helmet when riding a bicycle, skiing, or doing any other sport or activity that has a risk of injury.   If you drink alcohol:  ? Limit how much you use to:  ? 0?1 drink a day for nonpregnant women.  ? 0?2 drinks a day for men.  ? Be aware of how much alcohol is in your drink. In the [place] one drink equals one 12 oz bottle of beer (355 mL), one 5 oz glass of wine (148 mL), or one 1? oz glass of hard liquor (44 mL).   Take safety measures in your home, such as:  ? Removing clutter and tripping hazards from floors and stairways.  ? Using grab bars in bathrooms and handrails by stairs.  ? Placing non-slip mats on floors  and in bathtubs.  ? Improving lighting in dim areas.  Where to find more information   Centers for Disease Control and Prevention: http://www.wolf.info/  Get help right away if:   You have:  ? A severe headache that is not helped by medicine.  ? Trouble walking or weakness in your arms and legs.  ? Clear or bloody fluid coming from your nose or ears.  ? Changes in your vision.  ? A seizure.  ? Increased  confusion or irritability.   Your symptoms get worse.   You are sleepier than normal and have trouble staying awake.   You lose your balance.   Your pupils change size.   Your speech is slurred.   Your dizziness gets worse.   You vomit.  These symptoms may represent a serious problem that is an emergency. Do not wait to see if the symptoms will go away. Get medical help right away. Call your local emergency services (911 in the [place] Do not drive yourself to the hospital.  Summary   Head injuries can be minor, or they can be a serious medical issue requiring immediate attention.   Treatment for this condition depends on the severity and type of injury you have.   Have someone stay with you for 24 hours after your injury and check on you often.   Ask your health care provider when it is safe for you to return to your regular activities, including work or school.   Head injury prevention includes wearing a seat belt in a motor vehicle, using a helmet on a bicycle, limiting alcohol use, and taking safety measures in your home.  This information is not intended to replace advice given to you by your health care provider. Make sure you discuss any questions you have with your health care provider.  Document Released: 2005-08-28 Document Updated: 2019-07-11 Document Reviewed: 2019-07-11  Elsevier Patient Education ? [place].

## 2022-11-24 DIAGNOSIS — R112 Nausea with vomiting, unspecified: Secondary | ICD-10-CM

## 2022-11-24 NOTE — ED Triage Notes (Signed)
37 year old male arrives with c/o nausea without vomiting x 45 minutes after leaving [employer]. Pt noted with pressured speech, endorses 'a lot' of marijuana use today, denies current alcohol use. Pt denies recent fevers, but endorses exposure to Covid from his probation case worker. Pt expresses that he will step outside for a cigarette and marijuana use after Triage.

## 2022-11-24 NOTE — ED Notes (Signed)
Pt frequently leaving building to go outside to smoke. Pt noted with continued pressure of speech and occasional abrupt and rapid movements.     Estrella Deeds, RN  11/24/22 2233

## 2022-11-24 NOTE — Discharge Instructions (Signed)
You were seen in the emergency department for nausea and vomiting.   Your labs are still pending but you elected to leave.     Please return for any concerns or worsening symptoms.

## 2022-11-24 NOTE — Unmapped (Signed)
Name Carl Ross  DOB April 23, 1986  Age 37 y.o.  CC   Chief Complaint   Patient presents with   . Nausea       CHIEF COMPLAINT: Nausea    HISTORY OF PRESENT ILLNESS:  Carl Ross is a 37 y.o. male with history of schizoaffective disorder who presents to the emergency department with nausea.  Describes feeling nauseous over the past several hours.  He has been smoking marijuana.  No other acute concerns.  History is somewhat limited by patient's behavior, agitated at times, often not answering my questions.  Denies fever, headache, chest pain, shortness of breath, abdominal pain, vomiting/diarrhea.  Denies visual auditory hallucinations or suicidal ideation.    PAST MEDICAL, FAMILY AND SOCIAL HISTORY:  Past Medical History:   Diagnosis Date   . Schizoaffective disorder (CMS-HCC) (HHS-HCC)     History reviewed. No pertinent surgical history.     Social History     Tobacco Use   . Smoking status: Every Day     Packs/day: .5     Types: Cigarettes   . Smokeless tobacco: Never   Substance Use Topics   . Alcohol use: Not Currently      Social History     Substance and Sexual Activity   Drug Use Yes   . Types: Marijuana    Comment: "a lot"        No family history on file.     Discharge Medication List as of 11/24/2022 11:17 PM      CONTINUE these medications which have NOT CHANGED    Details   chlorproMAZINE (Thorazine) 100 mg tablet Take by mouth in the morning, at noon, and at bedtime., Historical Med      hydrOXYzine pamoate (Vistaril) 25 mg capsule Take 25 mg by mouth if needed in the morning, at noon, and at bedtime for itching., Historical Med      lidocaine (Lidoderm) 5 % patch Apply 1 patch topically once daily. Remove & discard patch within 12 hours or as directed by MD., Starting Mon 12/19/2021, Print      LORazepam (Ativan) 1 mg tablet Take 1 mg by mouth every 6 (six) hours if needed for anxiety., Historical Med      OLANZapine (ZyPREXA) 10 mg tablet Take by mouth at bedtime., Historical Med               REVIEW OF  SYSTEMS  Review of Systems - Constitutional: Negative for fevers. HEENT: Negative for rhinorrhea, sore throat. Neck: Negative for pain. Pulmonary: Negative for cough, shortness of breath. Cardiovascular: Negative for chest pain. Abdominal: Positive for nausea.  Negative for abdominal pain, vomiting/diarrhea. GU: Negative for urinary symptoms. Neuro: Negative for headache.  Psych: Agitation, marijuana use.  No hallucinations, SI or HI.    PHYSICAL EXAM:   Vital Signs:   ED Triage Vitals [11/24/22 2114]   Temp Pulse Resp BP   36.4 C (97.6 F) 106 20 (!) 137/101      SpO2 Temp Source Heart Rate Source Patient Position   96 % Temporal -- Sitting      BP Location FiO2 (%)     Left arm --       Alert, well nourished, well developed, appears comfortable lying in the stretcher but becomes frequently agitated.  Head: NC/AT.  Eyes: Conjunctivae pink, no jaundice.  ENT: Moist mucosa, pink and intact.   Neck: Supple.  Pulmonary: Clear to auscultation bilaterally, no wheezes, rales or rhonchi  Cardiac: RRR, no chest wall  tenderness to palpation.  Abdomen: Soft, non-tender, non-distended, no masses, no peritoneal signs.  Extremities: No pedal edema, calf tenderness to palpation, or asymmetry. Pulses 2 + left radial and right radial artery.  Skin:  Moist, pink, no rash, no swelling, brisk capillary refill.  Psych: Behavior is intermittently agitated, yelling at staff at times, can be verbally redirected initially.  Neuro: A&Ox3, moves all extremities, normal gait.    LABORATORY STUDIES:  Labs Reviewed   COMPREHENSIVE METABOLIC PANEL - Abnormal       Result Value    Sodium 138      Potassium 3.8      Chloride 105      CO2 (Bicarbonate) 22      Anion Gap 11      BUN 12      Creatinine 1.04      eGFRcr 95      Glucose 88      Fasting? Unknown      Calcium 9.5      AST 52 (*)     ALT 26      Alkaline phosphatase 82      Protein, total 6.9      Albumin 4.2      Bilirubin, total 0.5     SDS, SERUM DRUG SCREEN - Normal    Acetaminophen  level <5      Ethanol level, plasma/serum <53      Salicylate level 0.8      Benzodiazepines screen, serum Not Detected      Tricyclics screen, serum Not Detected     RAPID SARS-COV-2 AND INFLUENZA A/B RNA BY PCR - Normal    SARS-CoV-2 RNA PCR Not Detected      Influenza A RNA Not Detected      Influenza B RNA Not Detected     COVID-19 AND OTHER RESPIRATORY VIRAL PCR TESTING    Narrative:     The following orders were created for panel order COVID-19 and Other Respiratory Viral PCR Tests.  Procedure                               Abnormality         Status                     ---------                               -----------         ------                     Rapid SARS-CoV-2 and Inf.Marland Kitchen.[[id number]]  Normal              Final result                 Please view results for these tests on the individual orders.   CBC W/DIFF    Narrative:     The following orders were created for panel order CBC and differential.  Procedure                               Abnormality         Status                     ---------                               -----------         ------  CBC w/ Differential[[id number]]                              Final result                 Please view results for these tests on the individual orders.   CBC WITH DIFFERENTIAL    WBC 4.9      RBC 4.84      Hemoglobin 14.8      Hematocrit 43.3      MCV 89.5      MCH 30.6      MCHC 34.2      RDW-CV 13.3      RDW-SD 43.8      Platelets 227      MPV 10.0      Neutrophil % 53.8      Lymphocyte % 32.2      Monocytes % 11.8      Eosinophils % 1.6      Basophils % 0.4      Immature Granulocytes % 0.2      NRBC % 0.0      Neutrophils Absolute 2.64      Lymphocytes Absolute 1.58      Monocytes Absolute 0.58      Eosinophils Absolute 0.08      Basophils Absolute 0.02      Immature Granulocytes Absolute 0.01      NRBC Absolute 0.00          IMAGING STUDIES:  No orders to display        PROCEDURES:  Procedures     ED MEDICATIONS:       ED COURSE &  MDM  Diagnoses as of 11/26/22 0610   Nausea and vomiting, unspecified vomiting type         MEDICAL DECISION MAKING     36 year old male with history of schizoaffective disorder presents to the ED with nausea, frequent marijuana use.  Overall well-appearing, afebrile, abdomen without tenderness.  Not actively vomiting or having diarrhea.  Agitated in the ED, initially could be verbally redirected but started having frequent arguments with security at escalated from verbal altercations to physical interactions with them.  Labs were drawn initially as well as COVID and flu test.  The patient later admits to leave AMA despite continued attempts to verbally tracked him back to stretcher to wait for his results.  Demanded to leave AMA.  Understands that results are still pending.  Interpreter Used: No    CHRONIC CONDITIONS  Schizoaffective disorder     ACUTE CONDITIONS  Nausea    1. Nausea and vomiting, unspecified vomiting type          Nursing notes and prior medical records reviewed.  [place], [place]  11/26/22 [phone]

## 2022-11-24 NOTE — ED Notes (Signed)
Patient in [employer]. Patient throwing shirt/belongings on floor. MD, PA, & RN at bedside deescalating patient, education provided on need to comply with hospital policy and not to disrupt department. Patient requesting to leave hospital. Patient ambulated out of ER with all belongings.      Gloriajean Dell, RN  11/24/22 [phone]

## 2022-11-24 NOTE — Unmapped (Signed)
Provider in Triage Evaluation:     Focused HPI   No chief complaint on file.    HPI  37yo M who presents to the ED for evaluation of nausea. Patient states he has had nausea, generalized weakness, headache that started 45 minutes ago. Denies fever, chills, abdominal pain, vomiting, diarrhea. Smokes marijuana daily, states "I smoked a lot of marijuana today". Possible COVID exposure earlier this week.               No data recorded                                Patient History   Past Medical History:   Diagnosis Date   . Schizoaffective disorder (CMS-HCC) (HHS-HCC)      No past surgical history on file.  No family history on file.  Social History     Tobacco Use   . Smoking status: Every Day     Packs/day: .5     Types: Cigarettes   . Smokeless tobacco: Never   Substance Use Topics   . Alcohol use: Not Currently   . Drug use: Yes     Types: Marijuana       Focused Review of Systems   Review of Systems    Focused Physical Exam   ED Triage Vitals   Temp Pulse Resp BP   -- -- -- --      SpO2 Temp src Heart Rate Source Patient Position   -- -- -- --      BP Location FiO2 (%)     -- --       Physical Exam  Constitutional:       General: He is not in acute distress.     Appearance: Normal appearance. He is not ill-appearing.   Abdominal:      General: There is no distension.      Palpations: Abdomen is soft.      Tenderness: There is no abdominal tenderness. There is no guarding or rebound.   Neurological:      General: No focal deficit present.      Mental Status: He is alert and oriented to person, place, and time. Mental status is at baseline.       No orders to display       Labs Reviewed - No data to display    Triage MDM/Plan      37yo M who presents to the ED for evaluation of nausea. Will get labs and COVID/flu. Awaiting further ED provider evaluation.    Daylene Katayama, PA    Medical screening evaluation was performed in triage and workup was initiated. Patient was advised further evaluation and treatment may be  required and advised not to leave the Emergency Department until completed.     Daylene Katayama, [place]  11/24/22 2120

## 2022-11-24 NOTE — ED Provider Notes (Signed)
HPI   Chief Complaint   Patient presents with   . Nausea       HPI   this is a 37 year old male with a history of schizoaffective disorder who presents for evaluation of nausea.  The patient reports that he gradually developed moderately intense nausea, for the past 2 to 3 hours.  On my interview, he has no other complaint: No headache, no fever or rash, no chest pain shortness of the cough, no abdominal pain, no vomiting or diarrhea.  Symptoms worsened by smoking marijuana, reports smoking a lot of marijuana today.  No leg swelling or calf pain.  No suicidal ideation.  To me, he denies headache.    SOCIAL HISTORY: Uses tobacco and marijuana            Glasgow Coma Scale Score: 15                                  Patient History   Past Medical History:   Diagnosis Date   . Schizoaffective disorder (CMS-HCC) (HHS-HCC)      History reviewed. No pertinent surgical history.  No family history on file.  Social History     Tobacco Use   . Smoking status: Every Day     Packs/day: .5     Types: Cigarettes   . Smokeless tobacco: Never   Vaping Use   . Vaping Use: Never used   Substance Use Topics   . Alcohol use: Not Currently   . Drug use: Yes     Types: Marijuana     Comment: "a lot"       Review of Systems   Review of Systems  As noted above in history of present illness.  Physical Exam   ED Triage Vitals [11/24/22 2114]   Temp Pulse Resp BP   36.4 C (97.6 F) 106 20 (!) 137/101      SpO2 Temp Source Heart Rate Source Patient Position   96 % Temporal -- Sitting      BP Location FiO2 (%)     Left arm --       Physical Exam   Constitution: Awake and alert, intermittently agitated but redirectable.  Eyes without icterus.  ENT: Oral mucosa is moist.  Head is atraumatic.  Lungs are clear and equal bilaterally.  Heart tachycardia, rate 100 and without murmur.  Abdomen soft nondistended nontender without guarding or peritonitis.  Extremities are without edema.  Neuro: Awake and alert with a GCS of 15, normal speech, steady  gait.  Psych: No visual auditory hallucinations, no suicidal homicidal ideation, however, he is frequently agitated, although we are initially able to verbally de-escalate the patient.    No orders to display       Labs Reviewed   COMPREHENSIVE METABOLIC PANEL - Abnormal       Result Value    Sodium 138      Potassium 3.8      Chloride 105      CO2 (Bicarbonate) 22      Anion Gap 11      BUN 12      Creatinine 1.04      eGFRcr 95      Glucose 88      Fasting? Unknown      Calcium 9.5      AST 52 (*)     ALT 26      Alkaline phosphatase  82      Protein, total 6.9      Albumin 4.2      Bilirubin, total 0.5     SDS, SERUM DRUG SCREEN - Normal    Acetaminophen level <5      Ethanol level, plasma/serum <76      Salicylate level 0.8      Benzodiazepines screen, serum Not Detected      Tricyclics screen, serum Not Detected     RAPID SARS-COV-2 AND INFLUENZA A/B RNA BY PCR - Normal    SARS-CoV-2 RNA PCR Not Detected      Influenza A RNA Not Detected      Influenza B RNA Not Detected     COVID-19 AND OTHER RESPIRATORY VIRAL PCR TESTING    Narrative:     The following orders were created for panel order COVID-19 and Other Respiratory Viral PCR Tests.  Procedure                               Abnormality         Status                     ---------                               -----------         ------                     Rapid SARS-CoV-2 and Inf.Marland Kitchen.[[id number]]  Normal              Final result                 Please view results for these tests on the individual orders.   CBC W/DIFF    Narrative:     The following orders were created for panel order CBC and differential.  Procedure                               Abnormality         Status                     ---------                               -----------         ------                     CBC w/ Differential[[id number]]                              Final result                 Please view results for these tests on the individual orders.   CBC WITH DIFFERENTIAL    WBC 4.9       RBC 4.84      Hemoglobin 14.8      Hematocrit 43.3      MCV 89.5      MCH 30.6      MCHC 34.2      RDW-CV 13.3      RDW-SD 43.8  Platelets 227      MPV 10.0      Neutrophil % 53.8      Lymphocyte % 32.2      Monocytes % 11.8      Eosinophils % 1.6      Basophils % 0.4      Immature Granulocytes % 0.2      NRBC % 0.0      Neutrophils Absolute 2.64      Lymphocytes Absolute 1.58      Monocytes Absolute 0.58      Eosinophils Absolute 0.08      Basophils Absolute 0.02      Immature Granulocytes Absolute 0.01      NRBC Absolute 0.00     URINE DRUG SCREEN       ED Course & MDM   Diagnoses as of 11/25/22 0058   Nausea and vomiting, unspecified vomiting type       Medical Decision Making  Nausea and vomiting, unspecified vomiting type: acute illness or injury  Amount and/or Complexity of Data Reviewed  External Data Reviewed: notes.  Labs: ordered. Decision-making details documented in ED Course.      Risk  Diagnosis or treatment significantly limited by social determinants of health.        This is a currently nontoxic afebrile 37 year old male who presents for evaluation of nausea in the setting of heavy marijuana use.  Differential diagnose includes toxic metabolic disturbance, gastroenteritis, coronavirus infection, marijuana induced nausea, but, given the absence of abdominal pain, and given the absence of any abdominal tenderness on examination, doubt acute surgical abdomen.  Initially, in the absence of headache or acute neurological deficits or change, doubt intracranial injury.  He does have pressured speech with recent agitation, however, there is no visual auditory elucidation, no suicidal or homicidal ideation.  Labs were obtained.  Unfortunately, despite multiple treatment attempts by several providers to verbally de-escalate the patient, he becomes increasingly agitated in the emergency department, Frequent Verbal and Physical Fights with Security, and Demands to Leave the Emergency Department.  He Is  Aware That His Blood Tests Have Not Returned yet and Cannot Provide a Diagnosis He Understands These Risks and Leaves AGAINST MEDICAL ADVICE.    DIAGNOSIS: Nausea        Everest Brod B. Maronda Caison, MD  11/25/22 1248

## 2022-11-25 ENCOUNTER — Inpatient Hospital Stay
Admit: 2022-11-25 | Discharge: 2022-11-25 | Disposition: A | Discharge status: Home / Self Care | Attending: Emergency Medicine

## 2022-11-25 DIAGNOSIS — R109 Unspecified abdominal pain: Secondary | ICD-10-CM

## 2022-11-25 LAB — COMPREHENSIVE METABOLIC PANEL
ALT: 26 U/L (ref 0–55)
AST: 52 U/L — ABNORMAL HIGH (ref 6–42)
Albumin: 4.2 g/dL (ref 3.2–5.0)
Alkaline phosphatase: 82 U/L (ref 30–130)
Anion Gap: 11 mmol/L (ref 3–14)
BUN: 12 mg/dL (ref 6–24)
Bilirubin, total: 0.5 mg/dL (ref 0.2–1.2)
CO2 (Bicarbonate): 22 mmol/L (ref 20–32)
Calcium: 9.5 mg/dL (ref 8.5–10.5)
Chloride: 105 mmol/L (ref 98–110)
Creatinine: 1.04 mg/dL (ref 0.55–1.30)
Glucose: 88 mg/dL (ref 70–139)
Potassium: 3.8 mmol/L (ref 3.6–5.2)
Protein, total: 6.9 g/dL (ref 6.0–8.4)
Sodium: 138 mmol/L (ref 135–146)
eGFRcr: 95 mL/min/{1.73_m2} (ref >=60)

## 2022-11-25 LAB — SDS, SERUM DRUG SCREEN
Acetaminophen level: <5 ug/mL (ref <=30)
Benzodiazepines screen, serum: NOT DETECTED
Ethanol level, plasma/serum: <10 mg/dL (ref <=10)
Salicylate level: 0.8 mg/dL (ref 0.0–30.0)
Tricyclics screen, serum: NOT DETECTED mg/dL

## 2022-11-25 LAB — CBC WITH DIFFERENTIAL
Basophils %: 0.4 %
Basophils Absolute: 0.02 10*3/uL (ref 0.00–0.22)
Eosinophils %: 1.6 %
Eosinophils Absolute: 0.08 10*3/uL (ref 0.00–0.50)
Hematocrit: 43.3 % (ref 37.0–53.0)
Hemoglobin: 14.8 g/dL (ref 13.0–17.5)
Immature Granulocytes %: 0.2 %
Immature Granulocytes Absolute: 0.01 10*3/uL (ref 0.00–0.10)
Lymphocyte %: 32.2 %
Lymphocytes Absolute: 1.58 10*3/uL (ref 0.70–4.00)
MCH: 30.6 pg (ref 26.0–34.0)
MCHC: 34.2 g/dL (ref 31.0–37.0)
MCV: 89.5 fL (ref 80.0–100.0)
MPV: 10 fL (ref 9.1–12.4)
Monocytes %: 11.8 %
Monocytes Absolute: 0.58 10*3/uL (ref 0.38–0.83)
NRBC %: 0 % (ref 0.0–0.0)
NRBC Absolute: 0 10*3/uL (ref 0.00–2.00)
Neutrophil %: 53.8 %
Neutrophils Absolute: 2.64 10*3/uL (ref 1.50–7.95)
Platelets: 227 10*3/uL (ref 150–400)
RBC: 4.84 M/uL (ref 4.20–5.90)
RDW-CV: 13.3 % (ref 11.5–14.5)
RDW-SD: 43.8 fL (ref 35.0–51.0)
WBC: 4.9 10*3/uL (ref 4.0–11.0)

## 2022-11-25 LAB — RAPID SARS-COV-2 AND INFLUENZA A/B RNA BY PCR
Influenza A RNA: NOT DETECTED
Influenza B RNA: NOT DETECTED
SARS-CoV-2 RNA PCR: NOT DETECTED

## 2022-11-25 NOTE — ED Notes (Signed)
Patient presents to ER with reports of continued nausea without vomiting. Reports feeling chills and abdominal pain. Patient also reports callous on bilateral feet from walking; reports pain and would like to soak his feet. Patient with pressured speech. Calm and cooperative at this time.      Gloriajean Dell, RN  11/25/22 [phone]

## 2022-11-25 NOTE — Discharge Instructions (Signed)
Your blood tests are normal, and on examination there does not at this time appear to be a worrisome surgical condition in your abdomen. Sometimes heavy marijuana use can cause nausea. We recommend that you decrease or stop your marijuana use.

## 2022-11-25 NOTE — ED Provider Notes (Signed)
HPI   Chief Complaint   Patient presents with   . Nausea   . callouses to feet.       HPI   this is a 82 show male with a history of schizoaffective disorder who returns for evaluation of ongoing nausea.  The patient was evaluated in this emergency room earlier today for nausea, had a benign abdomen at that time, labs were obtained, but the patient left [place] prior to resulting of his last completion of medical care.  He almost immediately return to the triage area to check-in, with a chief concern in the triage area of nausea.  He is sleeping upon my evaluation, and when aroused, he denies any nausea, he initially is confused as to why he is here, and then states that he was having mild crampy right-sided abdominal pain, which is currently not present.  He is currently symptom-free: No headache chest pain shortness of cough fever, no ongoing nausea, no vomiting or diarrhea.  He also has no visual auditory hallucinations, no suicidal homicidal ideation.  He does report using a copious amount of marijuana today, which she believes exacerbated his symptoms.    SOCIAL HISTORY: Uses tobacco and marijuana            Glasgow Coma Scale Score: 15                                  Patient History   Past Medical History:   Diagnosis Date   . Schizoaffective disorder (CMS-HCC) (HHS-HCC)      History reviewed. No pertinent surgical history.  No family history on file.  Social History     Tobacco Use   . Smoking status: Every Day     Packs/day: .5     Types: Cigarettes   . Smokeless tobacco: Never   Vaping Use   . Vaping Use: Never used   Substance Use Topics   . Alcohol use: Not Currently   . Drug use: Yes     Types: Marijuana     Comment: "a lot"       Review of Systems   Review of Systems  As noted above in history of present illness  Physical Exam   ED Triage Vitals [11/25/22 0100]   Temp Pulse Resp BP   37.1 C (98.8 F) 100 18 127/77      SpO2 Temp Source Heart Rate Source Patient Position   95 % Temporal  Monitor Sitting      BP Location FiO2 (%)     Left arm --       Physical Exam   Constitutional: Sleeping comfortably, easily arousable.  Head without evidence of acute trauma.  Eyes with 5 mm equal react pupils, EOMI without icterus or disconjugate gaze.  ENT: Oral mucosa is moist.  Skin is warm dry without rash or jaundice.  Lungs are clear and equal bilaterally without rales rhonchi or wheezes.  Heart is regular rate and rhythm without murmur.  Abdomen soft nondistended nontender, without guarding peritonitis, no organomegaly, no tenderness McBurney's point, negative Murphy sign.  Extremities without edema.  Neuro: When aroused, he is awake and alert with a GCS of 15, normal speech, steady gait.  Psych: No agitation on this visit, no visual auditory hallucinations, no suicidal or homicidal ideation.    No orders to display       Labs Reviewed - No data to display  ED Course & MDM   Diagnoses as of 11/25/22 0219   Abdominal pain, unspecified abdominal location       Medical Decision Making  Abdominal pain, unspecified abdominal location: self-limited or minor problem  Risk  Diagnosis or treatment significantly limited by social determinants of health.        This is a 37 year old with a history of schizoaffective disorder who returns to the Emergency Department for reevaluation.  He has been seen in this emergency room earlier today with a chief concern of nausea, but left [place] prior to lab results and completion of medical care.  He returned and checked into the triage area with a report of nausea, however, when I awakened the patient from sleep, he denies nausea, stated that he been having mild crampy right-sided abdominal pain, which is currently absent.  He is currently symptom-free, without headache, subjective fever, abdominal pain or nausea.  On examination, his abdomen is completely benign and nontender to any examination of deep palpation.  There is no evidence of acute surgical  abdomen.  Given his resolution of pain, and lack of tenderness on examination, I doubt an acute surgical abdomen, and although was considered, abdominal imaging is not indicated.  I reviewed the lab test from his immediately prior visit today, and they are all normal, without evidence of acute liver disease renal failure electrolyte disturbance or leukocytosis.  No contraindication to discharge.  Encouraged the patient to follow-up with PCP or healthcare for the homeless within 24 to 48 hours, strict return precautions.  In addition, given that he feels that his nausea has been exacerbated by marijuana use, I advised the patient that overuse of marijuana could result in nausea and/or vomiting and encouraged him to decrease his marijuana use.    DIAGNOSIS: Abdominal pain and nausea, resolved        [place] Delong B. Praneel Haisley, MD  11/25/22 1254

## 2022-11-25 NOTE — Other (Signed)
Patient Education  Table of Contents   Abdominal Pain, Adult   Nausea, Adult   Preventing Marijuana Misuse, Adult    To view videos and all your education online visit,  https://pe.elsevier.com/HNQ7UihL  or scan this QR code with your smartphone.  Access to this content will expire in one year.  Abdominal Pain, Adult    Pain in the abdomen (abdominal pain) can be caused by many things. In most cases, it gets better with no treatment or by being treated at home. But in some cases, it can be serious.  Your health care provider will ask questions about your medical history and do a physical exam to try to figure out what is causing your pain.  Follow these instructions at home:  Medicines   Take over-the-counter and prescription medicines only as told by your provider.   Do not take medicines that help you poop (laxatives) unless told by your provider.  General instructions   Watch your condition for any changes.   Drink enough fluid to keep your pee (urine) pale yellow.  Contact a health care provider if:   Your pain changes, gets worse, or lasts longer than expected.   You have severe cramping or bloating in your abdomen, or you vomit.   Your pain gets worse with meals, after eating, or with certain foods.   You are constipated or have diarrhea for more than 2?3 days.   You are not hungry, or you lose weight without trying.   You have signs of dehydration. These may include:  ? Dark pee, very little pee, or no pee.  ? Cracked lips or dry mouth.  ? Sleepiness or weakness.   You have pain when you pee (urinate) or poop.   Your abdominal pain wakes you up at night.   You have blood in your pee.   You have a fever.  Get help right away if:   You cannot stop vomiting.   Your pain is only in one part of the abdomen. Pain on the right side could be caused by appendicitis.   You have bloody or black poop (stool), or poop that looks like tar.   You have trouble breathing.   You have chest pain.  These symptoms may be an  emergency. Get help right away. Call 911.   Do not wait to see if the symptoms will go away.   Do not drive yourself to the hospital.  This information is not intended to replace advice given to you by your health care provider. Make sure you discuss any questions you have with your health care provider.  Document Released: 2005-06-07 Document Updated: 2022-06-14 Document Reviewed: 2022-06-14  Elsevier Patient Education ? [place].  Nausea, Adult  Nausea is the feeling of having an upset stomach or that you are about to vomit. Nausea on its own is not usually a serious concern, but it may be an early sign of a more serious medical problem. As nausea gets worse, it can lead to vomiting. If vomiting develops, or if you are not able to drink enough fluids, you are at risk of becoming dehydrated.  Dehydration can make you tired and thirsty, cause you to have a dry mouth, and decrease how often you urinate. Older adults and people with other diseases or a weak disease-fighting system (immune system) are at higher risk for dehydration. The main goals of treating your nausea are:   To relieve your nausea.   To limit repeated nausea  episodes.   To prevent vomiting and dehydration.  Follow these instructions at home:  Watch your symptoms for any changes. Tell your health care provider about them.  Eating and drinking       Take an oral rehydration solution (ORS). This is a drink that is sold at pharmacies and retail stores.   Drink clear fluids slowly and in small amounts as you are able. Clear fluids include water, ice chips, low-calorie sports drinks, and fruit juice that has water added (diluted fruit juice).   Eat bland, easy-to-digest foods in small amounts as you are able. These foods include bananas, applesauce, rice, lean meats, toast, and crackers.   Avoid drinking fluids that contain a lot of sugar or caffeine, such as energy drinks, sports drinks, and soda.   Avoid alcohol.   Avoid spicy or fatty  foods.  General instructions   Take over-the-counter and prescription medicines only as told by your health care provider.   Rest at home while you recover.   Drink enough fluid to keep your urine pale yellow.   Breathe slowly and deeply when you feel nauseous.   Avoid smelling things that have strong odors.   Wash your hands often using soap and water for at least 20 seconds. If soap and water are not available, use hand sanitizer.   Make sure that everyone in your household washes their hands well and often.   Keep all follow-up visits. This is important.  Contact a health care provider if:   Your nausea gets worse.   Your nausea does not go away after two days.   You vomit multiple times.   You cannot drink fluids without vomiting.   You have any of the following:  ? New symptoms.  ? A fever.  ? A headache.  ? Muscle cramps.  ? A rash.  ? Pain while urinating.   You feel light-headed or dizzy.  Get help right away if:   You have pain in your chest, neck, arm, or jaw.   You feel extremely weak or you faint.   You have vomit that is bright red or looks like coffee grounds.   You have bloody or black stools (feces) or stools that look like tar.   You have a severe headache, a stiff neck, or both.   You have severe pain, cramping, or bloating in your abdomen.   You have difficulty breathing or are breathing very quickly.   Your heart is beating very quickly.   Your skin feels cold and clammy.   You feel confused.   You have signs of dehydration, such as:  ? Dark urine, very little urine, or no urine.  ? Cracked lips.  ? Dry mouth.  ? Sunken eyes.  ? Sleepiness.  ? Weakness.  These symptoms may be an emergency. Get help right away. Call 911.    Do not wait to see if the symptoms will go away.   Do not drive yourself to the hospital.  Summary   Nausea is the feeling that you have an upset stomach or that you are about to vomit. Nausea on its own is not usually a serious concern, but it may be an early sign of a more  serious medical problem.   If vomiting develops, or if you are not able to drink enough fluids, you are at risk of becoming dehydrated.   Follow recommendations for eating and drinking and take over-the-counter and prescription medicines only as told by your  health care provider.   Contact a health care provider right away if your symptoms worsen or you have new symptoms.   Keep all follow-up visits. This is important.  This information is not intended to replace advice given to you by your health care provider. Make sure you discuss any questions you have with your health care provider.  Document Released: 2004-10-05 Document Updated: 2021-03-04 Document Reviewed: 2021-03-04  Elsevier Patient Education ? 2024 Else[employer] Inc.  Preventing Marijuana Misuse, Adult  Marijuana is a mixture of the dried leaves and flowers of the hemp plant Cannabis sativa. The plant's active ingredients (cannabinoids) change the chemistry of the brain. If you smoke or eat marijuana, you will have changes in the way you think, feel, and behave.  In some cases, marijuana is prescribed by a health care provider (medical marijuana) for temporary relief of a medical condition. It can have medical effects, such as pain relief or reduced nausea. Many people use marijuana for recreational purposes because it helps them relax and puts them in a good mood (marijuana high).  How can marijuana use affect me?  Effects of marijuana are mental and physical. It can have negative effects, both short-term and long-term. When used for recreation, marijuana is often used in higher doses and for longer periods. High doses of marijuana can cause unpleasant side effects. It is also possible to become addicted to marijuana.  Short-term effects of marijuana use include:   Physical effects:  ? Increased heart rate.  ? Slowed movement, coordination, and reaction time. This can lead to accidents.  ? Red eyes and changes in vision.  ? Increased  hunger.  ? Coughing.   Mental effects:  ? Changes in mood and perception, such as an altered sense of time.  ? Poor memory, decision-making, and problem-solving.  ? Being very sleepy.  High doses of marijuana can cause:   Panic.    Anxiety.    Confusion.   Severe dizziness.   Vomiting.   Hallucinations. This means you see, hear, taste, smell, or feel things that are not real.  What can increase my risk?  You may be at a higher risk of marijuana misuse if you:   Have a family history of drug addiction.    Often use other substances, like alcohol.   Began using marijuana at an early age.   Have had past issues with substance use disorder.  What can happen if I keep using marijuana?  If you use marijuana for a long time, it can have long-term effects such as:   Higher risk of health problems, including:  ? Lung and breathing problems.   ? Possible higher risk of heart and blood vessel problems.  ? Possible higher risk of lung or testicular cancer.  ? A decrease in the body's disease-fighting system (immune system).   Mental and physical dependence (addiction).   Hallucinations or periods of false perceptions or beliefs (paranoia).   Worsening of mental illness or onset of new mental illness. This can include anxiety, depression, or suicidal thoughts.   Trouble maintaining healthy relationships.   Poor memory and trouble concentrating and learning. This can result in lower intelligence and poor performance at school or work.   Higher risk of using other substances like alcohol, nicotine, and illegal drugs.   A condition called cannabinoid hyperemesis syndrome. This causes repeated episodes of intense abdominal pain, nausea, and vomiting.  Quitting marijuana after using it for a long time can cause withdrawal symptoms, such as  headache, shakiness, and cravings for the drug.  What actions can I take to stop using marijuana?    If you are not physically or mentally dependent on marijuana, you should be able to stop using  it on your own. Taking these actions may help:   Find healthy ways to cope with stress, such as exercise, meditation, or spending time with family and friends. Talk with your health care provider about how you feel and how to cope with stress.    Spend time with people who do not use marijuana. Or, make new friends who do not use marijuana.   Do something else instead of using marijuana. Exercise, start a new hobby, or take part in activities with others.   Do not be afraid to say no if someone offers you marijuana. Speak up about why you do not want to use drugs. You can be a positive role model for others.  If you cannot stop on your own, ask your health care provider for help. Treatment for marijuana misuse may include:   Cognitive-behavioral therapy (psychotherapy). This may include individual or group therapy.   Joining a support group.    Treating other medical, behavioral, or mental health conditions that you may have.  What are the effects of vaping?  Vaping devices, such as e-cigarettes, may be used to smoke marijuana products. Vaping involves inhaling an aerosol or vapor made from a liquid or dry substance that is heated in the device.  People who vape may also use marijuana concentrates. Vaping these products can result in more side effects than the use of plant marijuana, such as:   Inhaling toxic substances, including metals and volatile organic compounds (VOCs) from the devices and solvents used.   Inhaling heated air. This has been shown to burn lung tissue and cause lung diseases and lung cancer.   Increased risk of mental illness, such as psychosis and schizophrenia.  Where to find more information   Centers for Disease Control and Prevention Librarian, academic): StoreMirror.com.cy   [employer] of Health: VoipGurus.fr   Substance Abuse and Mental Health Services Administration: SamedayNews.com.cy  Contact a health care provider if:   You want to stop using marijuana but you cannot.   You have withdrawal symptoms when you  try to stop using marijuana.   You are using marijuana every day or with other drugs.   You need to use more and more marijuana to get the same desired effect.   You have anxiety or depression.   You have hallucinations or paranoia.   Marijuana use is interfering with your relationships or your ability to function at school or work.   You have severe stomach pain, nausea, and vomiting.  Get help right away if:   You have chest pain or shortness of breath.   You have fast or irregular heartbeats (palpitations).  These symptoms may be an emergency. Get help right away. Call 911.   Do not wait to see if the symptoms will go away.   Do not drive yourself to the hospital.  This information is not intended to replace advice given to you by your health care provider. Make sure you discuss any questions you have with your health care provider.  Document Released: 2015-08-20 Document Updated: 2022-02-02 Document Reviewed: 2022-02-02  Elsevier Patient Education ? [place].

## 2022-11-25 NOTE — ED Triage Notes (Signed)
37 year old male arrives with c/o continued nausea the same as when he had been triaged earlier tonight. Pt continues with pressured speech stating he is now going to consider himself disabled so he can receive benefits. He further endorses wanting to have calluses on his feet evaluated.

## 2022-11-26 ENCOUNTER — Inpatient Hospital Stay: Admit: 2022-11-26 | Discharge: 2022-11-26 | Discharge status: Against Medical Advice

## 2022-11-26 NOTE — ED Notes (Signed)
Patient called x 1 for triage. No answer.      Shirley Friar, RN  11/26/22 [id number]

## 2022-11-26 NOTE — ED Notes (Signed)
Patient no longer in ED waiting room.     Shirley Friar, RN  11/26/22 [phone]

## 2022-11-28 ENCOUNTER — Inpatient Hospital Stay: Admit: 2022-11-28 | Discharge: 2022-11-28 | Discharge status: Against Medical Advice

## 2022-11-28 NOTE — ED Triage Notes (Signed)
Pt states yesterday he struck his right forearm against a pole and now is feeling increased forearm pain. [facility] WNL.

## 2022-12-05 ENCOUNTER — Inpatient Hospital Stay: Admit: 2022-12-05 | Discharge: 2022-12-05 | Disposition: A | Discharge status: Home / Self Care | Attending: Contractor

## 2022-12-05 DIAGNOSIS — M25532 Pain in left wrist: Secondary | ICD-10-CM

## 2022-12-05 NOTE — ED Provider Notes (Signed)
EMERGENCY DEPARTMENT ENCOUNTER    ATTENDING NOTE  Date of service: 12/05/2022  1:28 AM    HISTORY OF PRESENT ILLNESS:    Carl Ross is a 37 y.o. male with schizoaffective disorder presents with 3-week history of left wrist pain.  He states that he fell off a roof.  He admits that he has been to multiple emergency departments and has had multiple x-rays performed.  No acute findings have been noted but he continues to have discomfort.  He points to a scar over the ulnar aspect of the anterior wrist.  No limitation of range of motion of the wrist or fingers.  No weakness or numbness.  No other complaints.        PHYSICAL EXAM   Vitals and nursing note reviewed.     37 year old male in no apparent distress.  Resting comfortably.  He is lying on his left upper extremity hand propping himself up with his forearm and wrist.  Does not appear to be in any discomfort.  Normocephalic atraumatic.  Anicteric sclera.  Voice is normal.  Patient's left upper extremity has full range of motion at the shoulder elbow wrist and fingers.  Good sensation throughout.  2+ dorsalis pedis pulses.  No visible deformity or swelling.  There is a small scar over the ulnar aspect of the anterior wrist and he has some diffuse tenderness in this location.  No point tenderness.    LABS  Labs Reviewed - No data to display     RADIOLOGY  No orders to display       MEDICAL DECISION MAKING    Discussed with the patient at length and his main concern is that he continues to have discomfort in his wrist.  No indication for splinting at this time.  Will have the patient follow-up in hand surgery clinic.  Return precautions given.    CLINICAL IMPRESSION:    1. Wrist pain, acute, left           Timmothy Euler, MD  12/05/22 1619

## 2022-12-05 NOTE — Other (Signed)
Patient Education  Table of Contents   Wrist Pain, Adult    To view videos and all your education online visit,  https://pe.elsevier.com/[email or web address]  or scan this QR code with your smartphone.  Access to this content will expire in one year.  Wrist Pain, Adult  There are many things that can cause wrist pain. Some common causes include:   An injury to the wrist area, such as a sprain, strain, or broken bone (fracture).   Overuse of the joint.   A condition that causes increased pressure on a nerve in the wrist (carpal tunnel syndrome).   Wear and tear of the joints that occurs with aging (osteoarthritis).   Other types of joint inflammation and stiffness (arthritis).  Sometimes, the cause of wrist pain is not known. Often, the pain goes away when you follow instructions from your health care provider for relieving pain at home. This may include resting the wrist, icing the wrist, or using a splint or an elastic wrap for a short time. If your wrist pain continues, it is important to tell your provider.  Follow these instructions at home:  If you have a removable splint or elastic wrap:   Wear the splint or wrap as told by your provider. Remove it only as told by your provider. Ask your provider if you may remove it for bathing.   Check the skin around the splint or wrap every day. Tell your provider about any concerns.   Loosen the splint or wrap if your fingers tingle, become numb, or turn cold and blue.   Keep the splint or wrap clean.   If the splint or wrap is not waterproof:  ? Do not let it get wet.  ? Cover it with a watertight covering when you take a bath or shower.  Managing pain, stiffness, and swelling     If told, put ice on the painful area.  ? If you have a removable splint or wrap, remove it as told by your provider.  ? Put ice in a plastic bag.  ? Place a towel between your skin and the bag or between your splint or wrap and the bag.  ? Leave the ice on for 20 minutes, 2?3 times a day.   If your skin  turns bright red, remove the ice right away to prevent skin damage. The risk of damage is higher if you cannot feel pain, heat, or cold.   Move your fingers often to reduce stiffness and swelling.   Raise (elevate) the injured area above the level of your heart while you are sitting or lying down.  Activity   Rest your affected wrist as told by your provider.   Return to your normal activities as told by your provider. Ask your provider what activities are safe for you.   Ask your provider when it is safe to drive if you have a splint or wrap on your wrist.   Do exercises as told by your provider.  General instructions   Pay attention to any changes in your symptoms.   Take over-the-counter and prescription medicines only as told by your provider.  Contact a health care provider if:   You have a sudden, sharp pain in the wrist, hand, or arm that is different or new.   Any swelling or bruising on your wrist or hand gets worse.   Your skin becomes red, has a rash, or has open sores.   Your pain does not get better or  it gets worse.   You have a fever or chills.  Get help right away if:   You lose feeling in your fingers or hand.   Your fingers turn white, very red, or cold and blue.   You cannot move your fingers.  This information is not intended to replace advice given to you by your health care provider. Make sure you discuss any questions you have with your health care provider.  Document Released: 2005-06-07 Document Updated: 2022-06-02 Document Reviewed: 2022-06-02  Elsevier Patient Education ? [place].

## 2022-12-05 NOTE — ED Triage Notes (Signed)
Pt here stating that he injured his L wrist 2 weeks ago.  states it is still hurting and he has been to mult hospital for the same.   No obvious deformity noted

## 2022-12-05 NOTE — Discharge Instructions (Signed)
Please continue taking ibuprofen and Tylenol as needed for discomfort.    Please call hand surgery in the morning for an appointment.

## 2022-12-06 ENCOUNTER — Encounter: Admit: 2022-12-06 | Discharge: 2022-12-06 | Payer: BC Managed Care – PPO

## 2022-12-06 ENCOUNTER — Ambulatory Visit: Admit: 2022-12-06 | Discharge: 2022-12-06 | Payer: No Typology Code available for payment source

## 2022-12-06 DIAGNOSIS — 1 ERRONEOUS ENCOUNTER--DISREGARD: Secondary | ICD-10-CM

## 2022-12-06 DIAGNOSIS — U071 COVID-19: Secondary | ICD-10-CM

## 2022-12-06 LAB — CMP (EXT)
A/G Ratio (EXT): 1.6 (calc) (ref 1.0–2.5)
ALT/SGPT (EXT): 41 U/L (ref 9–46)
AST/SGOT (EXT): 49 U/L — ABNORMAL HIGH (ref 10–40)
Albumin (EXT): 4.6 g/dL (ref 3.6–5.1)
Alkaline Phosphatase (EXT): 83 U/L (ref 36–130)
BUN (EXT): 9 mg/dL (ref 7–25)
Bilirubin, Total (EXT): 0.4 mg/dL (ref 0.2–1.2)
CO2 (EXT): 24 mmol/L (ref 20–32)
CalciumCalcium (EXT): 9.7 mg/dL (ref 8.6–10.3)
Chloride (EXT): 106 mmol/L (ref 98–110)
Creatinine (EXT): 1.02 mg/dL (ref 0.60–1.26)
Globulin (EXT): 2.8 g/dL (ref 1.9–3.7)
Glucose (EXT): 91 mg/dL (ref 65–99)
Potassium (EXT): 3.8 mmol/L (ref 3.5–5.3)
Protein (EXT): 7.4 g/dL (ref 6.1–8.1)
Sodium (EXT): 140 mmol/L (ref 135–146)
eGFR - Creat CKD-EPI (EXT): 98 mL/min/{1.73_m2} (ref >=60)

## 2022-12-06 LAB — HEMOGLOBIN A1C: HEMOGLOBIN A1C % (INT/EXT): 5.6 %{Hb} (ref <5.7)

## 2022-12-06 LAB — HEP C AB WITH REFLEX QUANT (EXT): HEPATITIS C ANTIBODY (EXT): NONREACTIVE

## 2022-12-06 NOTE — Progress Notes
This encounter was created in error. Please disregard. 

## 2022-12-07 ENCOUNTER — Inpatient Hospital Stay: Admit: 2022-12-07 | Discharge: 2022-12-07 | Discharge status: Against Medical Advice

## 2022-12-29 DIAGNOSIS — M25571 Pain in right ankle and joints of right foot: Secondary | ICD-10-CM

## 2022-12-29 MED ORDER — ibuprofen tablet 400 mg
400 | Freq: Once | ORAL | Status: AC
Start: 2022-12-29 — End: 2022-12-29
  Administered 2022-12-30: 04:00:00 400 mg via ORAL

## 2022-12-29 NOTE — ED Provider Notes (Signed)
EMERGENCY DEPARTMENT ENCOUNTER    ATTENDING NOTE    Date of service: 12/29/2022 11:14 PM    Chief Complaint   Patient presents with   . Ankle Pain       Nursing notes reviewed and agreed with.    HISTORY OF PRESENT ILLNESS:    Carl Ross is a 37 y.o. male presents with pain at the right lateral aspect of his ankle where he has an ankle lock on.   No discharge to the area, feels it is rubbing the skin.  Denies any pain elsewhere.  Patient denies any fevers, chest pain shortness of breath.  Notes that he did smoke marijuana just prior to arrival, is hungry.      ROS  In addition to that documented in the HPI above, the additional ROS was obtained:  Constitutional: Denies fevers  Eyes: Denies vision changes  ENMT: Denies sore throat  CV: Denies chest pain  Resp: Denies SOB  Neuro: Denies syncope      PAST MEDICAL HISTORY / PROBLEM LIST    Past Medical History:   Diagnosis Date   . Schizoaffective disorder (CMS-HCC) (HHS-HCC)        There is no problem list on file for this patient.      PAST SURGICAL HISTORY    No past surgical history on file.    MEDICATIONS     Previous Medications    CHLORPROMAZINE (THORAZINE) 100 MG TABLET    Take by mouth in the morning, at noon, and at bedtime.    HYDROXYZINE PAMOATE (VISTARIL) 25 MG CAPSULE    Take 25 mg by mouth if needed in the morning, at noon, and at bedtime for itching.    LIDOCAINE (LIDODERM) 5 % PATCH    Apply 1 patch topically once daily. Remove & discard patch within 12 hours or as directed by MD.    LORAZEPAM (ATIVAN) 1 MG TABLET    Take 1 mg by mouth every 6 (six) hours if needed for anxiety.    OLANZAPINE (ZYPREXA) 10 MG TABLET    Take by mouth at bedtime.        ALLERGIES    No Known Allergies    SOCIAL HISTORY    Social History     Tobacco Use   . Smoking status: Every Day     Packs/day: .5     Types: Cigarettes   . Smokeless tobacco: Never   Substance Use Topics   . Alcohol use: Not Currently     Social History     Substance and Sexual Activity   Drug Use Yes    . Types: Marijuana    Comment: "a lot"       FAMILY HISTORY    No family history on file.  Family history reviewed and non-contributory to the current presentation.     Visit Vitals  BP 101/60 (BP Location: Left arm, Patient Position: Sitting)   Pulse (!) 126   Temp 37.9 C (100.3 F) (Temporal)   Resp 20      PHYSICAL EXAM  I have reviewed the triage vital signs  Const: speaking full sentences  Eyes: PERRL, conjunctival injection  HENT: NCAT, Neck supple without meningismus   Oropharynx: Wnl  CV: Tachycardic rate, normal rhythm, Warm, well-perfused extremities  RESP: CTAB, Unlabored respiratory effort  GI: soft, non-tender, non-distended, normal BS  MSK: No gross deformities appreciated.   There is mild skin irritation at the lateral malleolus underneath the ankle lock, no erythema no discharge,  full range of motion of the ankle and knee.  DP pulses intact.  Skin: Warm, dry. No rashes  Neuro: Alert, CNs II-XII grossly intact. Sensation and motor function of extremities grossly intact.  Psych: Appropriate mood and affect.    LABS    Labs Reviewed - No data to display     RADIOLOGY    No orders to display   LABS    Labs Reviewed - No data to display     Procedures    Procedures      MEDICAL DECISION MAKING & ED COURSE    All labs, records, and imaging were reviewed and interpreted by me (rads/ekg/monitor interpretation as above).     Bayley Hurn is a 37 y.o. male presents with right ankle pain underneath ankle LOC, likely skin irritation, no signs of infection.  Patient smoked marijuana prior to arrival, likely explaining his tachycardia.  No concern for fracture no indication for x-rays.  No indication for antibiotics.  Given wound care for friction supplies and recommended loosening the walk next time he has follow-up appointment with his parole officer.      Ultimately patient eloped after wound cares were applied and before repeat vitals and discharge paperwork could be given.      Diagnoses as of 12/30/22  0109   Acute right ankle pain       Pts treatment course and plan and strict return precautions discussed in detail and pt expressed understanding and agreeable with above plan. I have reviewed safe use of over the counter medications and the side effects of medications administered during today's visit or prescribed.     EXTERNAL RECORDS REVIEWED  I have review patients past medical records in our system and any relevant OSH records--     TESTS/TREATMENT/HOSPITALIZATION CONSIDERED  Considered x-ray but not indicated    CHRONIC CONDITIONS      DISCUSSION WITH OTHER PROVIDERS  Nursing    SOCIAL DETERMINATES OF HEALTH  Marijuana use    CLINICAL IMPRESSION:    1. Acute right ankle pain           Carl Labrum, MD  12/30/22 [phone]

## 2022-12-29 NOTE — Discharge Instructions (Signed)
You were seen in the emergency department 12/29/2022 for evaluation of right ankle pain.  Your ankle monitor seems to be rubbing on your ankle bone causing some discomfort.  You are given ibuprofen in the emergency department and Ace bandage was placed around her ankle to prevent the friction.  Be sure to follow-up with your primary care on Monday for continuance of care and repeat evaluation.  Your heart rate was also elevated in the emergency department.

## 2022-12-29 NOTE — Unmapped (Signed)
HPI   Chief Complaint   Patient presents with   . Ankle Pain       Patient is a 37 year old male with past medical history of schizoaffective disorder who presents today for evaluation of right ankle pain.  He states he was recently released from jail and has a ankle monitor on his right ankle, and states it has been rubbing against his skin.  He denies any falls or trauma to the ankle.  Has been ambulating and weightbearing without difficulty.  He also notes he recently smoked marijuana prior to arrival.  Otherwise he denies any complaints today.  Denies chest pain, shortness of breath, abdominal pain, vomiting, headaches.      History provided by:  Medical records and patient                Glasgow Coma Scale Score: 15                                  Patient History   Past Medical History:   Diagnosis Date   . Schizoaffective disorder (CMS-HCC) (HHS-HCC)      No past surgical history on file.  No family history on file.  Social History     Tobacco Use   . Smoking status: Every Day     Packs/day: .5     Types: Cigarettes   . Smokeless tobacco: Never   Vaping Use   . Vaping Use: Never used   Substance Use Topics   . Alcohol use: Not Currently   . Drug use: Yes     Types: Marijuana     Comment: "a lot"       Review of Systems   Review of Systems   Constitutional: Negative for fever.   HENT: Negative for congestion and sore throat.    Respiratory: Negative for cough and shortness of breath.    Cardiovascular: Negative for chest pain and palpitations.   Gastrointestinal: Negative for abdominal pain and vomiting.   Musculoskeletal: Negative for back pain.        + right ankle pain   Skin: Negative for rash.   Neurological: Negative for syncope.   All other systems reviewed and are negative.      Physical Exam   ED Triage Vitals [12/29/22 2309]   Temp Pulse Resp BP   37.9 C (100.3 F) (!) 126 20 101/60      SpO2 Temp Source Heart Rate Source Patient Position   96 % Temporal Monitor Sitting      BP Location FiO2 (%)      Left arm --       Physical Exam  Vitals and nursing note reviewed.   Constitutional:       Comments: Well developed, lying comfortably in stretcher, speaking in full sentences, no signs of respiratory distress   HENT:      Head: Normocephalic and atraumatic.      Mouth/Throat:      Mouth: Mucous membranes are moist.      Pharynx: Oropharynx is clear.   Eyes:      Extraocular Movements: Extraocular movements intact.      Conjunctiva/sclera: Conjunctivae normal.   Cardiovascular:      Rate and Rhythm: Regular rhythm. Tachycardia present.   Pulmonary:      Effort: Pulmonary effort is normal.      Breath sounds: No wheezing, rhonchi or rales.   Abdominal:  Palpations: Abdomen is soft.      Tenderness: There is no abdominal tenderness. There is no guarding or rebound.   Musculoskeletal:      Cervical back: Normal range of motion and neck supple.      Comments: Right ankle nontender, pain to superior aspect of lateral malleolus at site of friction with ankle monitor. FROM. Ankle monitor loose. Foot warm.   Skin:     General: Skin is warm and dry.   Neurological:      General: No focal deficit present.      Mental Status: He is alert and oriented to person, place, and time.      Comments: Lucid, following commands, speech is fluent, moving all extremities spontaneously       No orders to display       Labs Reviewed - No data to display    ED Course & MDM   Diagnoses as of 12/30/22 0630   Acute right ankle pain       Medical Decision Making  Summary: Patient is a 37 year old male with past medical history of schizoaffective disorder who presents today for evaluation of right ankle pain.      Differential diagnosis: No bony tenderness or trauma to raise concern for fracture.  Likely discomfort in the setting of friction between his ankle monitor and skin.  Ankle monitor is loose with visible space between the monitor and his ankle.    TESTS/TREATMENT/HOSPITALIZATION CONSIDERED  Interventions: Ibuprofen, Ace wrap to  prevent friction    CHRONIC CONDITIONS  Schizoaffective    ACUTE CONDITIONS  Right ankle pain, tachycardia, marijuana use    DISCUSSION WITH OTHER PROVIDERS  ED attending Dr. Willette Pa    Dispo: Plan for analgesia, and repeat heart rate after oral fluids.  Unfortunately, patient eloped from the emergency department after his ibuprofen and prior to his repeat vitals. Marijuana use likely contributing to his tachycardia.      Acute right ankle pain: acute illness or injury  Risk  Prescription drug management.                       Trudie Reed, [place]  01/02/23 1357

## 2022-12-29 NOTE — ED Triage Notes (Signed)
Pt reports recently released from jail 2 days ago. Pt stated he was fitted with ankle monitor. Pt reports abrasion from monitor. Pt reports Abilify injection and site R arm and L buttock are still sore. Requesting motrin.

## 2022-12-30 ENCOUNTER — Inpatient Hospital Stay
Admit: 2022-12-30 | Discharge: 2022-12-30 | Disposition: A | Discharge status: Home / Self Care | Attending: Emergency Medicine

## 2022-12-30 ENCOUNTER — Inpatient Hospital Stay: Admit: 2022-12-30 | Discharge: 2022-12-30 | Attending: Student in an Organized Health Care Education/Training Program

## 2022-12-30 DIAGNOSIS — F1290 Cannabis use, unspecified, uncomplicated: Secondary | ICD-10-CM

## 2022-12-30 MED FILL — IBUPROFEN 400 MG TABLET: 400 400 mg | ORAL | Qty: 1

## 2022-12-30 NOTE — ED Provider Notes (Signed)
HPI   Chief Complaint   Patient presents with   . Substance Use       I did not see this patient, Dr. Willette Pa did.                    No data recorded                                Patient History   Past Medical History:   Diagnosis Date   . Schizoaffective disorder (CMS-HCC) (HHS-HCC)      No past surgical history on file.  No family history on file.  Social History     Tobacco Use   . Smoking status: Every Day     Packs/day: .5     Types: Cigarettes   . Smokeless tobacco: Never   Vaping Use   . Vaping Use: Never used   Substance Use Topics   . Alcohol use: Not Currently   . Drug use: Yes     Types: Marijuana     Comment: "a lot"       Review of Systems   Review of Systems    Physical Exam   ED Triage Vitals [12/30/22 0239]   Temp Pulse Resp BP   37.4 C (99.3 F) (!) 120 20 116/70      SpO2 Temp Source Heart Rate Source Patient Position   95 % Temporal Monitor Sitting      BP Location FiO2 (%)     Left arm --       Physical Exam  No orders to display       Labs Reviewed - No data to display    ED Course & MDM   Diagnoses as of 01/14/23 1933   Marijuana use       MDM                    Molli Hazard B. Metro Kung, MD  01/14/23 1610

## 2022-12-30 NOTE — ED Notes (Signed)
Searched by public safety prior to triage     Bryn Gulling, RN  12/30/22 [phone]

## 2022-12-30 NOTE — ED Triage Notes (Addendum)
Pt just discharged from this ER 1 hour PTA. Smoked marijuana with "some shady characters" at [facility] and requesting drug screen.

## 2022-12-30 NOTE — ED Notes (Addendum)
Pt seen ambulating out of waiting room to outside multiple times.      Bryn Gulling, RN  12/30/22 0245       Bryn Gulling, RN  12/30/22 [phone]

## 2022-12-30 NOTE — Discharge Instructions (Signed)
You were seen in the Emergency Department on 12/30/22. Please follow-up with your PCP for further management. Return to the ED if symptoms worsen or new symptoms arise.

## 2022-12-30 NOTE — Unmapped (Signed)
Patient: Carl Ross   MRN: [id number]   Date of service: 12/30/2022   PCP: No PCP, Per Patient     EMERGENCY DEPARTMENT ENCOUNTER    CHIEF COMPLAINT       Chief Complaint   Patient presents with   . Substance Use       HPI     Carl Ross is a 37 y.o. male with a PMH of schizoaffective disorder who presents to the ED with for an evaluation. The patient was discharged from this ED 1 hour PTA. He reports that after leaving the ED previously he smoked marijuana and walked outside and saw "strangers" on the street. The patient denies any other substance abuse. The patient denies any SI or HI. He reports improvement of his symptoms since arrival to the ED.     Interpreter used: none    REVIEW OF SYSTEMS     Review of Systems   Constitutional: Negative for appetite change, chills, fatigue and fever.   HENT: Negative for congestion, rhinorrhea and sore throat.    Eyes: Negative for photophobia and visual disturbance.   Respiratory: Negative for cough and shortness of breath.    Cardiovascular: Negative for chest pain and palpitations.   Gastrointestinal: Negative for abdominal pain, constipation, diarrhea, nausea and vomiting.   Genitourinary: Negative for difficulty urinating, dysuria, flank pain, frequency and hematuria.   Musculoskeletal: Negative for arthralgias.   Skin: Negative for rash.   Allergic/Immunologic: Negative.    Neurological: Negative for dizziness, syncope, weakness, light-headedness, numbness and headaches.   Hematological: Does not bruise/bleed easily.   Psychiatric/Behavioral: Negative for behavioral problems, confusion and suicidal ideas.   All other systems reviewed and are negative.    PAST MEDICAL HISTORY         Past Medical History:   Diagnosis Date   . Schizoaffective disorder (CMS-HCC) (HHS-HCC)        CURRENT MEDICATIONS       Previous Medications    CHLORPROMAZINE (THORAZINE) 100 MG TABLET    Take by mouth in the morning, at noon, and at bedtime.    HYDROXYZINE PAMOATE (VISTARIL) 25 MG  CAPSULE    Take 25 mg by mouth if needed in the morning, at noon, and at bedtime for itching.    LIDOCAINE (LIDODERM) 5 % PATCH    Apply 1 patch topically once daily. Remove & discard patch within 12 hours or as directed by MD.    LORAZEPAM (ATIVAN) 1 MG TABLET    Take 1 mg by mouth every 6 (six) hours if needed for anxiety.    OLANZAPINE (ZYPREXA) 10 MG TABLET    Take by mouth at bedtime.         FAMILY HISTORY       No family history on file.    SURGICAL HISTORY       No past surgical history on file.    ALLERGIES       No Known Allergies    PHYSICAL EXAM        VITAL SIGNS:    ED Triage Vitals [12/30/22 0239]   Temp Pulse Resp BP   37.4 C (99.3 F) (!) 120 20 116/70      SpO2 Temp Source Heart Rate Source Patient Position   95 % Temporal Monitor Sitting      BP Location FiO2 (%)     Left arm --          General: The patient appears awake, alert, non-toxic, unkempt and in  no acute distress.  Head: Normocephalic, atraumatic.   Eyes: PERRL.  ENT: Moist mucus membranes.  Neck: Supple, trachea midline.  Respiratory: CTAB, non-labored breathing, symmetrical chest expansion, no respiratory distress.  CVS: Regular rate and rhythm, no peripheral edema.   GI: Soft, non-tender. Normoactive bowel sounds in all quadrants. No distention, masses or guarding.   Neuro: GCS15, AOx4, cranial nerves II-XII grossly intact, mentation is appropriate for age, moving all 4 extremities, steady gait.  Psych: Behavior is cooperative and pleasant. Affect is calm.  Skin: Warm, dry and intact. Normal capillary refill.     ED COURSE & MEDICAL DECISION MAKING        Pertinent Labs & Imaging studies reviewed. (See chart for details)  Nursing notes reviewed and agreed with.    Diagnoses as of 12/30/22 0914   Marijuana use     Medical Decision Making  37 y.o. male with a PMH of schizoaffective disorder presented to the ED with for an evaluation. On my evaluation of the patient, he reports his symptoms have resolved. He notes that after sleeping in  the ED for several hours he feels improved. He denies any complaints at this time. He was awake, alert, able to ambulate with a steady gait and tolerate PO intake. Patient was instructed to follow-up with their PCP in the next 3-5 days for further management and continuance of care. Strict return precautions given to the patient. Patient understands and agrees with discharge and follow-up plan. All questions answered at the bedside. Upon discharge, the patient remained stable and in no acute distress. Patient was given instructions to return to the ED if symptoms worsen or new symptoms arise.       Marijuana use: acute illness or injury  Amount and/or Complexity of Data Reviewed  External Data Reviewed: notes.      Risk  Diagnosis or treatment significantly limited by social determinants of health.    Risk Details: Substance abuse      CLINICAL IMPRESSION  1. Marijuana use         Final Disposition: Discharge with PCP follow-up      Marthenia Rolling, PA      [address] [place], [place]  12/30/22 [phone]

## 2023-01-08 ENCOUNTER — Inpatient Hospital Stay: Admit: 2023-01-08 | Discharge: 2023-01-08 | Discharge status: Against Medical Advice

## 2023-02-15 ENCOUNTER — Encounter: Admit: 2023-02-15 | Discharge: 2023-02-15 | Payer: BC Managed Care – PPO

## 2023-02-15 NOTE — Telephone Encounter
Patient contacted the clinic today stating that he has broken his contact.  We had attempted to call patient back.  Had to LVM indicating that we would like to have him call us back to schedule an appt with Dr T or to try calling [facility] to see if his rx is still valid in system to reorder    Please assist patient with an appt with Dr/ T  when patient calls back.

## 2023-02-26 ENCOUNTER — Ambulatory Visit: Admit: 2023-02-26 | Discharge: 2023-02-26 | Payer: No Typology Code available for payment source

## 2023-02-26 ENCOUNTER — Encounter: Admit: 2023-02-26 | Discharge: 2023-02-26 | Payer: BC Managed Care – PPO

## 2023-02-26 DIAGNOSIS — U071 COVID-19: Secondary | ICD-10-CM

## 2023-03-14 ENCOUNTER — Encounter: Admit: 2023-03-14 | Discharge: 2023-03-14 | Payer: BC Managed Care – PPO

## 2023-03-16 ENCOUNTER — Encounter: Admit: 2023-03-16 | Discharge: 2023-03-16 | Payer: BC Managed Care – PPO

## 2023-03-16 MED ORDER — PREDNISOLONE ACETATE 1 % OP DRPS
1 [drp] | Freq: Two times a day (BID) | OPHTHALMIC | 0 refills | 14.00000 days | Status: AC
Start: 2023-03-16 — End: ?

## 2023-03-16 MED ORDER — PREDNISOLONE ACETATE 1 % OP DRPS
1 [drp] | Freq: Two times a day (BID) | OPHTHALMIC | 0 refills | 14.00000 days | Status: DC
Start: 2023-03-16 — End: 2023-03-16

## 2023-04-02 ENCOUNTER — Encounter: Admit: 2023-04-02 | Discharge: 2023-04-02 | Payer: BC Managed Care – PPO

## 2023-04-06 ENCOUNTER — Encounter: Admit: 2023-04-06 | Discharge: 2023-04-06 | Payer: BC Managed Care – PPO

## 2023-04-23 ENCOUNTER — Encounter: Admit: 2023-04-23 | Discharge: 2023-04-23 | Payer: BC Managed Care – PPO

## 2023-05-23 ENCOUNTER — Encounter: Admit: 2023-05-23 | Discharge: 2023-05-23 | Payer: BC Managed Care – PPO

## 2023-05-25 ENCOUNTER — Encounter: Admit: 2023-05-25 | Discharge: 2023-05-25 | Payer: BC Managed Care – PPO

## 2023-05-25 ENCOUNTER — Ambulatory Visit: Admit: 2023-05-25 | Discharge: 2023-05-25 | Payer: No Typology Code available for payment source

## 2023-05-25 DIAGNOSIS — U071 COVID-19: Secondary | ICD-10-CM

## 2023-06-28 ENCOUNTER — Encounter: Admit: 2023-06-28 | Discharge: 2023-06-28 | Payer: BC Managed Care – PPO

## 2023-07-03 ENCOUNTER — Encounter: Admit: 2023-07-03 | Discharge: 2023-07-03 | Payer: BC Managed Care – PPO

## 2023-07-03 NOTE — Telephone Encounter
Tried to get patient in on 10/28, pt said he would need some notice to tell his job. Verified 12/21 warranty date, scheduled patient for 11/11.

## 2023-07-23 ENCOUNTER — Encounter: Admit: 2023-07-23 | Discharge: 2023-07-23 | Payer: BC Managed Care – PPO

## 2023-07-23 ENCOUNTER — Ambulatory Visit: Admit: 2023-07-23 | Discharge: 2023-07-24 | Payer: PRIVATE HEALTH INSURANCE

## 2024-09-19 ENCOUNTER — Encounter: Admit: 2024-09-19 | Discharge: 2024-09-19 | Payer: BLUE CROSS/BLUE SHIELD
